# Patient Record
Sex: Male | Born: 1950 | Race: Black or African American | Hispanic: No | Marital: Single | Smoking: Former smoker
Health system: Southern US, Community
[De-identification: ages and names within clinical notes are randomized; demographics above are authoritative.]

## PROBLEM LIST (undated history)

## (undated) DIAGNOSIS — N400 Enlarged prostate without lower urinary tract symptoms: Secondary | ICD-10-CM

## (undated) DIAGNOSIS — E119 Type 2 diabetes mellitus without complications: Secondary | ICD-10-CM

## (undated) DIAGNOSIS — N189 Chronic kidney disease, unspecified: Secondary | ICD-10-CM

## (undated) DIAGNOSIS — I10 Essential (primary) hypertension: Secondary | ICD-10-CM

## (undated) DIAGNOSIS — E7800 Pure hypercholesterolemia, unspecified: Secondary | ICD-10-CM

## (undated) DIAGNOSIS — Z978 Presence of other specified devices: Secondary | ICD-10-CM

## (undated) HISTORY — PX: COLONOSCOPY: SHX174

## (undated) HISTORY — PX: NO PAST SURGERIES: SHX2092

---

## 2003-05-11 ENCOUNTER — Emergency Department (HOSPITAL_COMMUNITY): Admission: EM | Admit: 2003-05-11 | Discharge: 2003-05-11 | Payer: Self-pay | Admitting: Emergency Medicine

## 2007-08-15 ENCOUNTER — Emergency Department (HOSPITAL_COMMUNITY): Admission: EM | Admit: 2007-08-15 | Discharge: 2007-08-16 | Payer: Self-pay | Admitting: Emergency Medicine

## 2007-09-19 ENCOUNTER — Emergency Department (HOSPITAL_COMMUNITY): Admission: EM | Admit: 2007-09-19 | Discharge: 2007-09-19 | Payer: Self-pay | Admitting: Emergency Medicine

## 2009-06-06 ENCOUNTER — Emergency Department (HOSPITAL_COMMUNITY): Admission: EM | Admit: 2009-06-06 | Discharge: 2009-06-06 | Payer: Self-pay | Admitting: Emergency Medicine

## 2011-02-22 ENCOUNTER — Emergency Department (HOSPITAL_COMMUNITY)
Admission: EM | Admit: 2011-02-22 | Discharge: 2011-02-22 | Disposition: A | Discharge status: Home / Self Care | Payer: 59 | Attending: Emergency Medicine | Admitting: Emergency Medicine

## 2011-02-22 ENCOUNTER — Encounter: Payer: Self-pay | Admitting: *Deleted

## 2011-02-22 DIAGNOSIS — I10 Essential (primary) hypertension: Secondary | ICD-10-CM | POA: Insufficient documentation

## 2011-02-22 DIAGNOSIS — Z8639 Personal history of other endocrine, nutritional and metabolic disease: Secondary | ICD-10-CM | POA: Insufficient documentation

## 2011-02-22 DIAGNOSIS — Z862 Personal history of diseases of the blood and blood-forming organs and certain disorders involving the immune mechanism: Secondary | ICD-10-CM | POA: Insufficient documentation

## 2011-02-22 DIAGNOSIS — M25569 Pain in unspecified knee: Secondary | ICD-10-CM | POA: Insufficient documentation

## 2011-02-22 HISTORY — DX: Essential (primary) hypertension: I10

## 2011-02-22 MED ORDER — PREDNISONE 20 MG PO TABS
60.0000 mg | ORAL_TABLET | Freq: Once | ORAL | Status: AC
  Administered 2011-02-22: 60 mg via ORAL
  Filled 2011-02-22: qty 3

## 2011-02-22 MED ORDER — HYDROCODONE-ACETAMINOPHEN 5-500 MG PO TABS: 1.0000 | ORAL_TABLET | Freq: Four times a day (QID) | ORAL | Status: AC | PRN

## 2011-02-22 MED ORDER — PREDNISONE 50 MG PO TABS: ORAL_TABLET | ORAL | Status: AC

## 2011-02-22 NOTE — ED Provider Notes (Signed)
History     CSN: [id number]  Arrival date & time 02/22/11  1205   First MD Initiated Contact with Patient 02/22/11 1237      Chief Complaint  Patient presents with  . Knee Pain    pt c/o gout pain to left knee that began 1 week ago.     (Consider location/radiation/quality/duration/timing/severity/associated sxs/prior treatment) HPI  Patient presents to emergency department complaining of a one to two-week history of left knee pain. Patient states the pain is similar to previous history of gout pain in his knee. Patient is seen by [facility] at [facility] by Dr. Wynelle Link and states that he has been treated with indomethacin in the past for gout. Patient states he had leftover indomethacin and pain meds from his previous gout flare and when the pain began a week ago he began to take the medicine once again. However patient states despite taking daily indomethacin and as needed pain meds he is continuing to have left knee pain. Patient has a followup appointment with his primary care physician at [facility] family medicine on Monday, 2 days from now. Patient states however he was concerned with ongoing knee pain. Patient denies any redness, swelling, or heat in his knee. He denies fevers or chills. Pain is aggravated by movement and improved with rest. Symptoms were gradual onset, persistent and unchanging.  Past Medical History  Diagnosis Date  . Hypertension   . Gout     History reviewed. No pertinent past surgical history.  History reviewed. No pertinent family history.  History  Substance Use Topics  . Smoking status: Former Games developer  . Smokeless tobacco: Not on file  . Alcohol Use: Yes      Review of Systems  All other systems reviewed and are negative.    Allergies  Review of patient's allergies indicates no known allergies.  Home Medications  No current outpatient prescriptions on file.  BP 176/84  Pulse 92  Temp(Src) 98.1 F (36.7 C) (Oral)  Resp 18  Wt 190 lb  (86.183 kg)  SpO2 98%  Physical Exam  Nursing note and vitals reviewed. Constitutional: He is oriented to person, place, and time. He appears well-developed.  HENT:  Head: Normocephalic and atraumatic.  Eyes: Conjunctivae are normal.  Neck: Normal range of motion. Neck supple.  Cardiovascular: Normal rate.   Pulmonary/Chest: Effort normal.  Musculoskeletal: Normal range of motion. He exhibits tenderness. He exhibits no edema.       TTP of entire knee without laxity of ant/post/med/lateral stress. Negative bollotment. No edema, erythema or heat. Pain with FROM but no crepitous. No heat or erythema of knee. FROM of bilateral hips without pain. No TTP or remainder of lower extremities.   Neurological: He is alert and oriented to person, place, and time.  Skin: Skin is warm and dry. No rash noted. No erythema. No pallor.    ED Course  Procedures (including critical care time)  Labs Reviewed - No data to display No results found.   1. Knee pain       MDM  Question acute gout flare versus arthritis. No signs or symptoms of septic joint. Lower extremities are neurovascularly intact. Patient has close followup appointments primary care physician in 2 days.        Jenness Corner, [place] 02/22/11 1255

## 2011-02-23 NOTE — ED Provider Notes (Signed)
Medical screening examination/treatment/procedure(s) were performed by non-physician practitioner and as supervising physician I was immediately available for consultation/collaboration.   Benny Lennert, MD 02/23/11 [phone]

## 2012-07-09 ENCOUNTER — Emergency Department (HOSPITAL_COMMUNITY)
Admission: EM | Admit: 2012-07-09 | Discharge: 2012-07-09 | Disposition: A | Discharge status: Home / Self Care | Payer: 59 | Attending: Emergency Medicine | Admitting: Emergency Medicine

## 2012-07-09 ENCOUNTER — Emergency Department (HOSPITAL_COMMUNITY): Payer: 59

## 2012-07-09 DIAGNOSIS — I10 Essential (primary) hypertension: Secondary | ICD-10-CM | POA: Insufficient documentation

## 2012-07-09 DIAGNOSIS — Z87891 Personal history of nicotine dependence: Secondary | ICD-10-CM | POA: Insufficient documentation

## 2012-07-09 DIAGNOSIS — M109 Gout, unspecified: Secondary | ICD-10-CM | POA: Insufficient documentation

## 2012-07-09 DIAGNOSIS — M79609 Pain in unspecified limb: Secondary | ICD-10-CM | POA: Insufficient documentation

## 2012-07-09 DIAGNOSIS — Z79899 Other long term (current) drug therapy: Secondary | ICD-10-CM | POA: Insufficient documentation

## 2012-07-09 MED ORDER — PREDNISONE 20 MG PO TABS: ORAL_TABLET | ORAL | Status: DC

## 2012-07-09 MED ORDER — HYDROCODONE-ACETAMINOPHEN 5-325 MG PO TABS: 1.0000 | ORAL_TABLET | Freq: Four times a day (QID) | ORAL | Status: DC | PRN

## 2012-07-09 NOTE — ED Provider Notes (Signed)
History    This chart was scribed for David Silk, PA working with David Roller, MD by ED Scribe, David Bonilla. This patient was seen in room WTR7/WTR7 and the patient's care was started at 6:05 PM.   CSN: [id number]  Arrival date & time 07/09/12  1739   First MD Initiated Contact with Patient 07/09/12 1805      Chief Complaint  Patient presents with  . Gout    (Consider location/radiation/quality/duration/timing/severity/associated sxs/prior treatment) HPI HPI Comments: David Bonilla is a 62 y.o. male with h/o gout who presents to the Emergency Department complaining of moderate constant right great toe pain due to gout which started Monday. Pt states he has it about 2-3 times a year in various joint regions of his legs bilaterally. Pt states that standing on his right foot exacerbates pain. He states the pain is 2/10 as of now. He states the pain radiates up into his right foot when he bares weight on it. Pt takes Indocin for his Gout.  Pt states when he was seen previously he was diagnosed with crystal gout and the doctor gave pt prednisone which "knocked it right out". Pt denies fever, chills, cough, nausea, vomiting, diarrhea, SOB, weakness, and any other associated symptoms.     Past Medical History  Diagnosis Date  . Hypertension   . Gout     No past surgical history on file.  No family history on file.  History  Substance Use Topics  . Smoking status: Former Games developer  . Smokeless tobacco: Not on file  . Alcohol Use: Yes      Review of Systems  Constitutional: Negative for fever and chills.  Musculoskeletal: Positive for myalgias and arthralgias.  All other systems reviewed and are negative.    Allergies  Review of patient's allergies indicates no known allergies.  Home Medications   Current Outpatient Rx  Name  Route  Sig  Dispense  Refill  . amLODipine (NORVASC) 5 MG tablet   Oral   Take 5 mg by mouth every morning.         . indomethacin  (INDOCIN) 50 MG capsule   Oral   Take 50 mg by mouth 3 (three) times daily with meals.             BP 160/83  Pulse 91  Temp(Src) 99.3 F (37.4 C) (Oral)  Resp 16  Ht 5\' 10"  (1.778 m)  Wt 215 lb (97.523 kg)  BMI 30.85 kg/m2  SpO2 99%  Physical Exam  Nursing note and vitals reviewed. Constitutional: He is oriented to person, place, and time. He appears well-developed and well-nourished. No distress.  HENT:  Head: Normocephalic and atraumatic.  Right Ear: External ear normal.  Left Ear: External ear normal.  Nose: Nose normal.  Eyes: Conjunctivae are normal.  Neck: Normal range of motion. No tracheal deviation present.  Cardiovascular: Normal rate, regular rhythm, normal heart sounds, intact distal pulses and normal pulses.   Pulmonary/Chest: Effort normal and breath sounds normal. No stridor.  Abdominal: Soft. He exhibits no distension. There is no tenderness.  Musculoskeletal: Normal range of motion. He exhibits tenderness.  Tender to palpation to the medial aspect of right great toe (MTP joint).   Neurological: He is alert and oriented to person, place, and time.  Skin: Skin is warm and dry. He is not diaphoretic.  Psychiatric: He has a normal mood and affect. His behavior is normal.    ED Course  Procedures (including critical care time)  DIAGNOSTIC STUDIES: Oxygen Saturation is 99% on room air, normal by my interpretation.    COORDINATION OF CARE: 6:11 PM Discussed ED treatment with pt and pt agrees.  6:37 PM Discussed with pt gout is evident in right foot.    Labs Reviewed - No data to display Dg Foot Complete Right  07/09/2012   *RADIOLOGY REPORT*  Clinical Data: Right foot pain.  No known injuries.  Possible gout.  RIGHT FOOT COMPLETE - 3+ VIEW  Comparison: None.  Findings: No evidence of acute or subacute fracture or dislocation. Erosion with an overhanging edge involving the head of the first metatarsal at the first MTP joint.  Joint spaces well preserved,  including the first MTP joint space.  Mild soft tissue swelling overlying the first MTP joint. Prominent talar beak.  Tiny plantar calcaneal spur.  IMPRESSION: Possible gout involving the first MTP joint.   Original Report Authenticated By: Hulan Saas, M.D.     1. Gout       MDM  Patient is a 62 year old male with history of gout. He has been experiencing pain in his right great toe consistent with gout flare. XR shows possible gout. Pain has not improved with indomethacin. He will be given a 5 day course of prednisone and some pain medications. He will follow up with his PCP if symptoms do not resolve. Return instructions given. Vital signs stable for discharge. Patient / Family / Caregiver informed of clinical course, understand medical decision-making process, and agree with plan.      I personally performed the services described in this documentation, which was scribed in my presence. The recorded information has been reviewed and is accurate.     Mora Bellman, PA-C 07/09/12 1914

## 2012-07-09 NOTE — ED Notes (Signed)
Pt c/o R great toe pain consistent with gout symptoms. Pt states pain started on Monday. Pt states he has taken Indomethacin for pain, but it is not helping. Pt ambulatory to exam room with steady gait. Pt states he drove himself here.

## 2012-07-10 NOTE — ED Provider Notes (Signed)
Medical screening examination/treatment/procedure(s) were performed by non-physician practitioner and as supervising physician I was immediately available for consultation/collaboration.    Vida Roller, MD 07/10/12 [phone]

## 2012-09-29 ENCOUNTER — Ambulatory Visit: Payer: 59

## 2014-05-24 ENCOUNTER — Encounter (HOSPITAL_COMMUNITY): Payer: Self-pay | Admitting: Emergency Medicine

## 2014-05-24 ENCOUNTER — Emergency Department (HOSPITAL_COMMUNITY)
Admission: EM | Admit: 2014-05-24 | Discharge: 2014-05-24 | Disposition: A | Discharge status: Home / Self Care | Payer: 59 | Attending: Emergency Medicine | Admitting: Emergency Medicine

## 2014-05-24 DIAGNOSIS — I10 Essential (primary) hypertension: Secondary | ICD-10-CM | POA: Insufficient documentation

## 2014-05-24 DIAGNOSIS — Z7952 Long term (current) use of systemic steroids: Secondary | ICD-10-CM | POA: Insufficient documentation

## 2014-05-24 DIAGNOSIS — Z791 Long term (current) use of non-steroidal anti-inflammatories (NSAID): Secondary | ICD-10-CM | POA: Diagnosis not present

## 2014-05-24 DIAGNOSIS — M109 Gout, unspecified: Secondary | ICD-10-CM | POA: Diagnosis not present

## 2014-05-24 DIAGNOSIS — Z87891 Personal history of nicotine dependence: Secondary | ICD-10-CM | POA: Insufficient documentation

## 2014-05-24 DIAGNOSIS — Z7982 Long term (current) use of aspirin: Secondary | ICD-10-CM | POA: Insufficient documentation

## 2014-05-24 DIAGNOSIS — Y92009 Unspecified place in unspecified non-institutional (private) residence as the place of occurrence of the external cause: Secondary | ICD-10-CM | POA: Diagnosis not present

## 2014-05-24 DIAGNOSIS — Y9389 Activity, other specified: Secondary | ICD-10-CM | POA: Diagnosis not present

## 2014-05-24 DIAGNOSIS — S61213A Laceration without foreign body of left middle finger without damage to nail, initial encounter: Secondary | ICD-10-CM | POA: Diagnosis present

## 2014-05-24 DIAGNOSIS — Y998 Other external cause status: Secondary | ICD-10-CM | POA: Diagnosis not present

## 2014-05-24 DIAGNOSIS — S61219A Laceration without foreign body of unspecified finger without damage to nail, initial encounter: Secondary | ICD-10-CM

## 2014-05-24 MED ORDER — PREDNISONE 20 MG PO TABS: 40.0000 mg | ORAL_TABLET | Freq: Every day | ORAL | Status: DC

## 2014-05-24 MED ORDER — LIDOCAINE HCL (PF) 1 % IJ SOLN
5.0000 mL | Freq: Once | INTRAMUSCULAR | Status: AC
  Administered 2014-05-24: 5 mL
  Filled 2014-05-24: qty 5
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
## 2014-05-24 NOTE — Discharge Instructions (Signed)
Laceration Care, Adult °A laceration is a cut or lesion that goes through all layers of the skin and into the tissue just beneath the skin. °TREATMENT  °Some lacerations may not require closure. Some lacerations may not be able to be closed due to an increased risk of infection. It is important to see your caregiver as soon as possible after an injury to minimize the risk of infection and maximize the opportunity for successful closure. °If closure is appropriate, pain medicines may be given, if needed. The wound will be cleaned to help prevent infection. Your caregiver will use stitches (sutures), staples, wound glue (adhesive), or skin adhesive strips to repair the laceration. These tools bring the skin edges together to allow for faster healing and a better cosmetic outcome. However, all wounds will heal with a scar. Once the wound has healed, scarring can be minimized by covering the wound with sunscreen during the day for 1 full year. °HOME CARE INSTRUCTIONS  °For sutures or staples: °· Keep the wound clean and dry. °· If you were given a bandage (dressing), you should change it at least once a day. Also, change the dressing if it becomes wet or dirty, or as directed by your caregiver. °· Wash the wound with soap and water 2 times a day. Rinse the wound off with water to remove all soap. Pat the wound dry with a clean towel. °· After cleaning, apply a thin layer of the antibiotic ointment as recommended by your caregiver. This will help prevent infection and keep the dressing from sticking. °· You may shower as usual after the first 24 hours. Do not soak the wound in water until the sutures are removed. °· Only take over-the-counter or prescription medicines for pain, discomfort, or fever as directed by your caregiver. °· Get your sutures or staples removed as directed by your caregiver. °For skin adhesive strips: °· Keep the wound clean and dry. °· Do not get the skin adhesive strips wet. You may bathe  carefully, using caution to keep the wound dry. °· If the wound gets wet, pat it dry with a clean towel. °· Skin adhesive strips will fall off on their own. You may trim the strips as the wound heals. Do not remove skin adhesive strips that are still stuck to the wound. They will fall off in time. °For wound adhesive: °· You may briefly wet your wound in the shower or bath. Do not soak or scrub the wound. Do not swim. Avoid periods of heavy perspiration until the skin adhesive has fallen off on its own. After showering or bathing, gently pat the wound dry with a clean towel. °· Do not apply liquid medicine, cream medicine, or ointment medicine to your wound while the skin adhesive is in place. This may loosen the film before your wound is healed. °· If a dressing is placed over the wound, be careful not to apply tape directly over the skin adhesive. This may cause the adhesive to be pulled off before the wound is healed. °· Avoid prolonged exposure to sunlight or tanning lamps while the skin adhesive is in place. Exposure to ultraviolet light in the first year will darken the scar. °· The skin adhesive will usually remain in place for 5 to 10 days, then naturally fall off the skin. Do not pick at the adhesive film. °You may need a tetanus shot if: °· You cannot remember when you had your last tetanus shot. °· You have never had a tetanus   shot. If you get a tetanus shot, your arm may swell, get red, and feel warm to the touch. This is common and not a problem. If you need a tetanus shot and you choose not to have one, there is a rare chance of getting tetanus. Sickness from tetanus can be serious. SEEK MEDICAL CARE IF:   You have redness, swelling, or increasing pain in the wound.  You see a red line that goes away from the wound.  You have yellowish-white fluid (pus) coming from the wound.  You have a fever.  You notice a bad smell coming from the wound or dressing.  Your wound breaks open before or  after sutures have been removed.  You notice something coming out of the wound such as wood or glass.  Your wound is on your hand or foot and you cannot move a finger or toe. SEEK IMMEDIATE MEDICAL CARE IF:   Your pain is not controlled with prescribed medicine.  You have severe swelling around the wound causing pain and numbness or a change in color in your arm, hand, leg, or foot.  Your wound splits open and starts bleeding.  You have worsening numbness, weakness, or loss of function of any joint around or beyond the wound.  You develop painful lumps near the wound or on the skin anywhere on your body. MAKE SURE YOU:   Understand these instructions.  Will watch your condition.  Will get help right away if you are not doing well or get worse. Document Released: 02/04/2005 Document Revised: 04/29/2011 Document Reviewed: 07/31/2010 [facility] Patient Information 2015 [place], [place]. This information is not intended to replace advice given to you by your health care provider. Make sure you discuss any questions you have with your health care provider. Gout Gout is an inflammatory arthritis caused by a buildup of uric acid crystals in the joints. Uric acid is a chemical that is normally present in the blood. When the level of uric acid in the blood is too high it can form crystals that deposit in your joints and tissues. This causes joint redness, soreness, and swelling (inflammation). Repeat attacks are common. Over time, uric acid crystals can form into masses (tophi) near a joint, destroying bone and causing disfigurement. Gout is treatable and often preventable. CAUSES  The disease begins with elevated levels of uric acid in the blood. Uric acid is produced by your body when it breaks down a naturally found substance called purines. Certain foods you eat, such as meats and fish, contain high amounts of purines. Causes of an elevated uric acid level include:  Being passed down from  parent to child (heredity).  Diseases that cause increased uric acid production (such as obesity, psoriasis, and certain cancers).  Excessive alcohol use.  Diet, especially diets rich in meat and seafood.  Medicines, including certain cancer-fighting medicines (chemotherapy), water pills (diuretics), and aspirin.  Chronic kidney disease. The kidneys are no longer able to remove uric acid well.  Problems with metabolism. Conditions strongly associated with gout include:  Obesity.  High blood pressure.  High cholesterol.  Diabetes. Not everyone with elevated uric acid levels gets gout. It is not understood why some people get gout and others do not. Surgery, joint injury, and eating too much of certain foods are some of the factors that can lead to gout attacks. SYMPTOMS   An attack of gout comes on quickly. It causes intense pain with redness, swelling, and warmth in a joint.  Fever can  occur.  Often, only one joint is involved. Certain joints are more commonly involved:  Base of the big toe.  Knee.  Ankle.  Wrist.  Finger. Without treatment, an attack usually goes away in a few days to weeks. Between attacks, you usually will not have symptoms, which is different from many other forms of arthritis. DIAGNOSIS  Your caregiver will suspect gout based on your symptoms and exam. In some cases, tests may be recommended. The tests may include:  Blood tests.  Urine tests.  X-rays.  Joint fluid exam. This exam requires a needle to remove fluid from the joint (arthrocentesis). Using a microscope, gout is confirmed when uric acid crystals are seen in the joint fluid. TREATMENT  There are two phases to gout treatment: treating the sudden onset (acute) attack and preventing attacks (prophylaxis).  Treatment of an Acute Attack.  Medicines are used. These include anti-inflammatory medicines or steroid medicines.  An injection of steroid medicine into the affected joint is  sometimes necessary.  The painful joint is rested. Movement can worsen the arthritis.  You may use warm or cold treatments on painful joints, depending which works best for you.  Treatment to Prevent Attacks.  If you suffer from frequent gout attacks, your caregiver may advise preventive medicine. These medicines are started after the acute attack subsides. These medicines either help your kidneys eliminate uric acid from your body or decrease your uric acid production. You may need to stay on these medicines for a very long time.  The early phase of treatment with preventive medicine can be associated with an increase in acute gout attacks. For this reason, during the first few months of treatment, your caregiver may also advise you to take medicines usually used for acute gout treatment. Be sure you understand your caregiver's directions. Your caregiver may make several adjustments to your medicine dose before these medicines are effective.  Discuss dietary treatment with your caregiver or dietitian. Alcohol and drinks high in sugar and fructose and foods such as meat, poultry, and seafood can increase uric acid levels. Your caregiver or dietitian can advise you on drinks and foods that should be limited. HOME CARE INSTRUCTIONS   Do not take aspirin to relieve pain. This raises uric acid levels.  Only take over-the-counter or prescription medicines for pain, discomfort, or fever as directed by your caregiver.  Rest the joint as much as possible. When in bed, keep sheets and blankets off painful areas.  Keep the affected joint raised (elevated).  Apply warm or cold treatments to painful joints. Use of warm or cold treatments depends on which works best for you.  Use crutches if the painful joint is in your leg.  Drink enough fluids to keep your urine clear or pale yellow. This helps your body get rid of uric acid. Limit alcohol, sugary drinks, and fructose drinks.  Follow your dietary  instructions. Pay careful attention to the amount of protein you eat. Your daily diet should emphasize fruits, vegetables, whole grains, and fat-free or low-fat milk products. Discuss the use of coffee, vitamin C, and cherries with your caregiver or dietitian. These may be helpful in lowering uric acid levels.  Maintain a healthy body weight. SEEK MEDICAL CARE IF:   You develop diarrhea, vomiting, or any side effects from medicines.  You do not feel better in 24 hours, or you are getting worse. SEEK IMMEDIATE MEDICAL CARE IF:   Your joint becomes suddenly more tender, and you have chills or a fever.  MAKE SURE YOU:   Understand these instructions.  Will watch your condition.  Will get help right away if you are not doing well or get worse. Document Released: 02/02/2000 Document Revised: 06/21/2013 Document Reviewed: 09/18/2011 [facility] Patient Information 2015 [place], [place]. This information is not intended to replace advice given to you by your health care provider. Make sure you discuss any questions you have with your health care provider.

## 2014-06-05 ENCOUNTER — Emergency Department (HOSPITAL_COMMUNITY)
Admission: EM | Admit: 2014-06-05 | Discharge: 2014-06-05 | Disposition: A | Discharge status: Home / Self Care | Payer: 59 | Attending: Emergency Medicine | Admitting: Emergency Medicine

## 2014-06-05 ENCOUNTER — Encounter (HOSPITAL_COMMUNITY): Payer: Self-pay | Admitting: Emergency Medicine

## 2014-06-05 DIAGNOSIS — I10 Essential (primary) hypertension: Secondary | ICD-10-CM | POA: Insufficient documentation

## 2014-06-05 DIAGNOSIS — Z87891 Personal history of nicotine dependence: Secondary | ICD-10-CM | POA: Insufficient documentation

## 2014-06-05 DIAGNOSIS — Z7982 Long term (current) use of aspirin: Secondary | ICD-10-CM | POA: Insufficient documentation

## 2014-06-05 DIAGNOSIS — Z791 Long term (current) use of non-steroidal anti-inflammatories (NSAID): Secondary | ICD-10-CM | POA: Insufficient documentation

## 2014-06-05 DIAGNOSIS — M109 Gout, unspecified: Secondary | ICD-10-CM | POA: Diagnosis not present

## 2014-06-05 DIAGNOSIS — Z4802 Encounter for removal of sutures: Secondary | ICD-10-CM | POA: Diagnosis present

## 2014-06-05 DIAGNOSIS — Z7952 Long term (current) use of systemic steroids: Secondary | ICD-10-CM | POA: Diagnosis not present

## 2014-06-05 DIAGNOSIS — Z79899 Other long term (current) drug therapy: Secondary | ICD-10-CM | POA: Diagnosis not present

## 2014-06-05 NOTE — ED Provider Notes (Signed)
CSN: [id number]     Arrival date & time 06/05/14  1840 History   First MD Initiated Contact with Patient 06/05/14 1909     Chief Complaint  Patient presents with  . Suture / Staple Removal     (Consider location/radiation/quality/duration/timing/severity/associated sxs/prior Treatment) Patient is a 64 y.o. male presenting with suture removal. The history is provided by the patient and medical records. No language interpreter was used.  Suture / Staple Removal Pertinent negatives include no fever, nausea, numbness, vomiting or weakness.    David Bonilla is a 64 y.o. male  Presents to the emergency department for suture removal of laceration which occurred on 05/24/2014.   Laceration to the left third digit  Repaired with 4 sutures. Patient denies fever, chills, erythema, induration, pain to the site, purulent drainage. He reports no trouble caring for the wound.   He has no other complaints.  Past Medical History  Diagnosis Date  . Hypertension   . Gout    History reviewed. No pertinent past surgical history. History reviewed. No pertinent family history. History  Substance Use Topics  . Smoking status: Former Research scientist (life sciences)  . Smokeless tobacco: Not on file  . Alcohol Use: Yes    Review of Systems  Constitutional: Negative for fever.  Gastrointestinal: Negative for nausea and vomiting.  Skin: Positive for wound.  Allergic/Immunologic: Negative for immunocompromised state.  Neurological: Negative for weakness and numbness.  Hematological: Does not bruise/bleed easily.  Psychiatric/Behavioral: The patient is not nervous/anxious.       Allergies  Review of patient's allergies indicates no known allergies.  Home Medications   Prior to Admission medications   Medication Sig Start Date End Date Taking? Authorizing Provider  amLODipine (NORVASC) 5 MG tablet Take 5 mg by mouth every morning.    Historical Provider, MD  aspirin EC 81 MG tablet Take 81 mg by mouth daily.     Historical Provider, MD  HYDROcodone-acetaminophen (NORCO/VICODIN) 5-325 MG per tablet Take 1 tablet by mouth every 6 (six) hours as needed for pain. Patient not taking: Reported on 05/24/2014 07/09/12   Cleatrice Burke, PA-C  indomethacin (INDOCIN) 50 MG capsule Take 50 mg by mouth 3 (three) times daily with meals.      Historical Provider, MD  predniSONE (DELTASONE) 20 MG tablet Take 2 tablets (40 mg total) by mouth daily. 05/24/14   Waynetta Pean, PA-C  rosuvastatin (CRESTOR) 40 MG tablet Take 40 mg by mouth at bedtime.    Historical Provider, MD   BP 150/87 mmHg  Pulse 90  Temp(Src) 98.4 F (36.9 C) (Oral)  Resp 20  SpO2 96% Physical Exam  Constitutional: He is oriented to person, place, and time. He appears well-developed and well-nourished. No distress.  HENT:  Head: Normocephalic and atraumatic.  Eyes: Conjunctivae are normal. No scleral icterus.  Neck: Normal range of motion.  Cardiovascular: Normal rate, regular rhythm, normal heart sounds and intact distal pulses.   No murmur heard. Capillary refill < 3 sec  Pulmonary/Chest: Effort normal and breath sounds normal. No respiratory distress.  Musculoskeletal: Normal range of motion. He exhibits no edema.  ROM: full range of motion of all fingers of the left hand  Neurological: He is alert and oriented to person, place, and time.  Sensation: intact to dull and sharp Strength: 5/5 in all fingers of the left hand  Skin: Skin is warm and dry. He is not diaphoretic.  2cm semicircular laceration to the left 3rd digit; healing well without erythema, induration or purulent  drainage No TTP of the area  Psychiatric: He has a normal mood and affect.  Nursing note and vitals reviewed.   ED Course  Procedures (including critical care time) Labs Review Labs Reviewed - No data to display  Imaging Review No results found.   EKG Interpretation None      MDM   Final diagnoses:  Visit for suture removal   [name]  presents to ER for staple/suture removal and wound check as above. Procedure tolerated well. Vitals normal, no signs of infection. Scar minimization & return precautions given at dc.    BP 150/87 mmHg  Pulse 90  Temp(Src) 98.4 F (36.9 C) (Oral)  Resp 20  SpO2 96%   Abigail Butts, PA-C 06/05/14 1924  Veryl Speak, MD 06/07/14 2330

## 2014-06-05 NOTE — ED Notes (Signed)
4 stitches to be removed on left middle finger. Edges well approximated, no drainage/redness visible.

## 2014-06-05 NOTE — Discharge Instructions (Signed)

## 2018-03-18 ENCOUNTER — Encounter (INDEPENDENT_AMBULATORY_CARE_PROVIDER_SITE_OTHER): Payer: Self-pay | Admitting: Ophthalmology

## 2018-07-24 ENCOUNTER — Other Ambulatory Visit: Payer: Self-pay | Admitting: Urology

## 2018-07-24 DIAGNOSIS — C61 Malignant neoplasm of prostate: Secondary | ICD-10-CM

## 2018-07-24 DIAGNOSIS — R972 Elevated prostate specific antigen [PSA]: Secondary | ICD-10-CM

## 2018-07-30 ENCOUNTER — Ambulatory Visit
Admission: RE | Admit: 2018-07-30 | Discharge: 2018-07-30 | Disposition: A | Discharge status: Home / Self Care | Payer: 59 | Source: Ambulatory Visit | Attending: Urology | Admitting: Urology

## 2018-07-30 ENCOUNTER — Other Ambulatory Visit: Payer: Self-pay | Admitting: Urology

## 2018-07-30 DIAGNOSIS — Z77018 Contact with and (suspected) exposure to other hazardous metals: Secondary | ICD-10-CM

## 2018-08-03 ENCOUNTER — Other Ambulatory Visit: Payer: Self-pay | Admitting: Urology

## 2018-08-07 ENCOUNTER — Other Ambulatory Visit: Payer: Self-pay | Admitting: Urology

## 2018-08-07 ENCOUNTER — Ambulatory Visit
Admission: RE | Admit: 2018-08-07 | Discharge: 2018-08-07 | Disposition: A | Discharge status: Home / Self Care | Payer: 59 | Source: Ambulatory Visit | Attending: Urology | Admitting: Urology

## 2018-08-07 ENCOUNTER — Other Ambulatory Visit: Payer: Self-pay

## 2018-08-07 DIAGNOSIS — Z77018 Contact with and (suspected) exposure to other hazardous metals: Secondary | ICD-10-CM

## 2018-08-19 ENCOUNTER — Other Ambulatory Visit: Payer: 59

## 2018-08-19 ENCOUNTER — Ambulatory Visit
Admission: RE | Admit: 2018-08-19 | Discharge: 2018-08-19 | Disposition: A | Discharge status: Home / Self Care | Payer: 59 | Source: Ambulatory Visit | Attending: Urology | Admitting: Urology

## 2018-08-19 DIAGNOSIS — R972 Elevated prostate specific antigen [PSA]: Secondary | ICD-10-CM

## 2018-08-19 DIAGNOSIS — C61 Malignant neoplasm of prostate: Secondary | ICD-10-CM

## 2018-08-19 MED ORDER — GADOBENATE DIMEGLUMINE 529 MG/ML IV SOLN
10.0000 mL | Freq: Once | INTRAVENOUS | Status: AC | PRN
  Administered 2018-08-19: 10 mL via INTRAVENOUS

## 2018-11-19 DIAGNOSIS — C801 Malignant (primary) neoplasm, unspecified: Secondary | ICD-10-CM

## 2018-11-19 HISTORY — DX: Malignant (primary) neoplasm, unspecified: C80.1

## 2018-12-23 ENCOUNTER — Other Ambulatory Visit: Payer: Self-pay | Admitting: Nephrology

## 2018-12-23 DIAGNOSIS — N1832 Chronic kidney disease, stage 3b: Secondary | ICD-10-CM

## 2018-12-23 DIAGNOSIS — N179 Acute kidney failure, unspecified: Secondary | ICD-10-CM

## 2018-12-24 ENCOUNTER — Ambulatory Visit
Admission: RE | Admit: 2018-12-24 | Discharge: 2018-12-24 | Disposition: A | Discharge status: Home / Self Care | Payer: 59 | Source: Ambulatory Visit | Attending: Nephrology | Admitting: Nephrology

## 2018-12-24 DIAGNOSIS — N1832 Chronic kidney disease, stage 3b: Secondary | ICD-10-CM

## 2018-12-24 DIAGNOSIS — N179 Acute kidney failure, unspecified: Secondary | ICD-10-CM

## 2019-01-01 ENCOUNTER — Other Ambulatory Visit: Payer: Self-pay | Admitting: Nephrology

## 2019-01-01 DIAGNOSIS — N1832 Chronic kidney disease, stage 3b: Secondary | ICD-10-CM

## 2019-01-11 ENCOUNTER — Ambulatory Visit
Admission: RE | Admit: 2019-01-11 | Discharge: 2019-01-11 | Disposition: A | Discharge status: Home / Self Care | Payer: 59 | Source: Ambulatory Visit | Attending: Nephrology | Admitting: Nephrology

## 2019-01-11 DIAGNOSIS — N1832 Chronic kidney disease, stage 3b: Secondary | ICD-10-CM

## 2019-03-19 ENCOUNTER — Other Ambulatory Visit: Payer: Self-pay | Admitting: Urology

## 2019-03-19 ENCOUNTER — Other Ambulatory Visit (HOSPITAL_COMMUNITY): Payer: Self-pay | Admitting: Urology

## 2019-03-22 ENCOUNTER — Other Ambulatory Visit (HOSPITAL_COMMUNITY): Payer: Self-pay | Admitting: Urology

## 2019-03-22 ENCOUNTER — Other Ambulatory Visit: Payer: Self-pay | Admitting: Urology

## 2019-03-22 DIAGNOSIS — C61 Malignant neoplasm of prostate: Secondary | ICD-10-CM

## 2019-03-31 ENCOUNTER — Encounter (HOSPITAL_BASED_OUTPATIENT_CLINIC_OR_DEPARTMENT_OTHER): Payer: Self-pay | Admitting: Urology

## 2019-03-31 ENCOUNTER — Other Ambulatory Visit: Payer: Self-pay

## 2019-03-31 NOTE — Progress Notes (Signed)
Spoke w/ via phone for pre-op interview---David Bonilla needs dos----  I stat 8, ekg             COVID test ------04-05-2019 Arrive at -------530 am 04-08-2019 NPO after ------midnight Medications to take morning of surgery -----metorpolol succinate, rosuvastatin, tamsulosin, amlodipine, finasteride Diabetic medication -----none day of surgery Patient Special Instructions -----patient given overnight stay instructions including bring all prescription medications in original containers Pre-Op special Istructions ----- Patient verbalized understanding of instructions that were given at this phone interview. Patient denies shortness of breath, chest pain, fever, cough a this phone interview.

## 2019-04-05 ENCOUNTER — Other Ambulatory Visit (HOSPITAL_COMMUNITY)
Admission: RE | Admit: 2019-04-05 | Discharge: 2019-04-05 | Disposition: A | Discharge status: Home / Self Care | Payer: 59 | Source: Ambulatory Visit | Attending: Urology | Admitting: Urology

## 2019-04-05 DIAGNOSIS — Z20822 Contact with and (suspected) exposure to covid-19: Secondary | ICD-10-CM | POA: Insufficient documentation

## 2019-04-05 DIAGNOSIS — Z01812 Encounter for preprocedural laboratory examination: Secondary | ICD-10-CM | POA: Diagnosis not present

## 2019-04-05 LAB — SARS CORONAVIRUS 2 (TAT 6-24 HRS): SARS Coronavirus 2: NEGATIVE

## 2019-04-07 ENCOUNTER — Encounter (HOSPITAL_COMMUNITY): Payer: Self-pay

## 2019-04-07 ENCOUNTER — Encounter (HOSPITAL_BASED_OUTPATIENT_CLINIC_OR_DEPARTMENT_OTHER): Payer: Self-pay | Admitting: Anesthesiology

## 2019-04-07 ENCOUNTER — Emergency Department (HOSPITAL_COMMUNITY)
Admission: EM | Admit: 2019-04-07 | Discharge: 2019-04-07 | Disposition: A | Discharge status: Home / Self Care | Payer: 59 | Attending: Emergency Medicine | Admitting: Emergency Medicine

## 2019-04-07 ENCOUNTER — Other Ambulatory Visit: Payer: Self-pay

## 2019-04-07 DIAGNOSIS — Z87891 Personal history of nicotine dependence: Secondary | ICD-10-CM | POA: Insufficient documentation

## 2019-04-07 DIAGNOSIS — I129 Hypertensive chronic kidney disease with stage 1 through stage 4 chronic kidney disease, or unspecified chronic kidney disease: Secondary | ICD-10-CM | POA: Diagnosis not present

## 2019-04-07 DIAGNOSIS — Z466 Encounter for fitting and adjustment of urinary device: Secondary | ICD-10-CM | POA: Insufficient documentation

## 2019-04-07 DIAGNOSIS — Z978 Presence of other specified devices: Secondary | ICD-10-CM

## 2019-04-07 DIAGNOSIS — N181 Chronic kidney disease, stage 1: Secondary | ICD-10-CM | POA: Insufficient documentation

## 2019-04-07 DIAGNOSIS — Z8546 Personal history of malignant neoplasm of prostate: Secondary | ICD-10-CM | POA: Insufficient documentation

## 2019-04-07 DIAGNOSIS — Z7984 Long term (current) use of oral hypoglycemic drugs: Secondary | ICD-10-CM | POA: Insufficient documentation

## 2019-04-07 DIAGNOSIS — Z79899 Other long term (current) drug therapy: Secondary | ICD-10-CM | POA: Insufficient documentation

## 2019-04-07 DIAGNOSIS — N401 Enlarged prostate with lower urinary tract symptoms: Secondary | ICD-10-CM | POA: Diagnosis not present

## 2019-04-07 DIAGNOSIS — E1122 Type 2 diabetes mellitus with diabetic chronic kidney disease: Secondary | ICD-10-CM | POA: Insufficient documentation

## 2019-04-07 NOTE — H&P (View-Only) (Signed)
I have prostate cancer.  HPI: David Bonilla is a 69 year-old male established patient who is here evaluation for treatment of prostate cancer.    David Bonilla returns today following urodynamics for retention. He has BPH with BOO and very low risk prostate cancer. He is currently on finasteride and tamsulosin but failed to void adequately at [facility] despite a good detrusor contraction. He had a foley placed on 11/25 for retention with bilateral hydro. His PSA had been rising but he had a good f/t ratio. He was given Cipro for his positive culture at [facility] in preparation for cystoscopy but would prefer not to have that procedure.   UDS results: David Bonilla held a max capacity of approx. 535 mls. His 1st sensation was felt at 377 mls. There was positive instability. He voided a small volume with urgency off this contraction. Filling was continued to obtain a voluntary void. He was able to generate a well sustained voluntary contraction but was only able to void 53 mls. Trabeculation and elevation of the bladder base was noted. No reflux was seen. A 16 fr foley was inserted post UDS.   He was biopsied for a PSA of 4.63 with 49% f/t ratio in 11/19. he was found to have a 186ml prostate and T1c Nx Mx Gleason 6(3+3) prostate cancer in 5% of the right apical medial core. He continues to void without difficulty and his PSA was up to 8.76 with a 40% f/t ratio in October prior to his diagnosis with retention. It was 6.39 with a 42% f/t ratio at this last visit in June and 5.01 with a 38% f/t ratio in February. He had an MRIP in 08/19/18 after the rise in the PSA and that showed no high risk lesions but on retrospective review, he did have a full bladder. He had minimal LUTS to belie his degree of retention.     AUA Symptom Score: Less than 20% of the time he has the sensation of not emptying his bladder completely when finished urinating. Less than 20% of the time he has to urinate again fewer than two hours after he  has finished urinating. He does not have to stop and start again several times when he urinates. He never finds it difficult to postpone urination. He never has a weak urinary stream. He never has to push or strain to begin urination. He has to get up to urinate 1 time from the time he goes to bed until the time he gets up in the morning.   Calculated AUA Symptom Score: 3    ALLERGIES: NSAIDs    MEDICATIONS: Cipro 250 mg tablet 1 tablet PO BID  Finasteride 5 mg tablet 1 tablet PO Daily  Tamsulosin Hcl 0.4 mg capsule 1 capsule PO Daily  Losartan-Hydrochlorothiazide 100 mg-25 mg tablet  Rosuvastatin Calcium 40 mg tablet     GU PSH: Complex cystometrogram, w/ void pressure and urethral pressure profile studies, any technique - 03/11/2019 Complex Uroflow - 03/11/2019 Emg surf Electrd - 03/11/2019 Inject For cystogram - 03/11/2019 Insert Bladder Cath; Complex - 01/13/2019 Intrabd voidng Press - 03/11/2019 Prostate Needle Biopsy - 12/25/2017     NON-GU PSH: Surgical Pathology, Gross And Microscopic Examination For Prostate Needle - 12/25/2017     GU PMH: Prostate Cancer - 02/24/2019, His PSA continues to rise and despite the negative MRI in July, he needs to have a repeat biopsy which I will arrange in the near future. Risks reviewed and levaquin sent. , - 11/26/2018 (  Stable), He is doing well but his PSA has continued to rise. the F/t ratio remains favorable and the MRIP showed no high risk lesions. He declined rectal exam today. I will just have him return in 3 months with a PSA. , - 08/27/2018 (Stable), He is doing well with a minimal increase in his PSA. I will have him return in 3 months with a PSA for an exam. , - 04/22/2018, T1c Nx Mx very low risk Gleason 6 prostate cancer with a 110ml prostate with minimal LUTS and moderate ED. I reviewed Active surveillance, RALP and EXRT. His prostate is too large for seeds, cryo and HIFU. He would like to pursue a course of surveillance and I will have him  return in 3 months with a PSA. , - 01/21/2018 BPH w/LUTS, He is having spasms with the foley and is on oxybutynin. the foley is in good position and the bladder is drained but the bladder walls are quite thick and he has persistent though improved bilateral hydronephrosis. I discussed treatment options including meds, Urolift, Rezum, TURP and Radical prostatectomy since he does have prostate cancer and he would like to try med. He will be started on finasteride and tamsulosin and I reviewed the side effects. he will return in about 2 weeks for urodynamics and will stop the oxybutynin 48 hours prior. He will then return with the results for possible cystoscopy. - 01/18/2019 Acute kidney failure - 01/13/2019 Hydronephrosis - 01/13/2019 Urinary Retention - 01/13/2019 ED due to arterial insufficiency, He has moderate ED. - 01/21/2018 Elevated PSA - 12/25/2017, He has 2 elevated PSA's in May with no historical comparisons. I am going to repeat the PSA with reflex to free/total after a week of sexual abstinence. If the PSA remains elevated, he will need a biopsy. I discussed the risks of bleeding, infection and voiding difficulty. I will consider valium for the biopsy since he was uncomfortable with the exam., - 09/15/2017 BPH w/o LUTS, He has minimal LUTs and a benign exam. - 09/15/2017    NON-GU PMH: Diabetes Type 2 GERD Gout Hypertension    FAMILY HISTORY: 1 Daughter - No Family History Breast Cancer - Mother Hypertension - Father, Brother Kidney Failure - Brother Lung Cancer - Brother stroke - Father   SOCIAL HISTORY: Marital Status: Single Preferred Language: English; Race: Black or African American Current Smoking Status: Patient does not smoke anymore.   Tobacco Use Assessment Completed: Used Tobacco in last 30 days? Drinks 1 drink per day. Types of alcohol consumed: Beer.  Drinks 2 caffeinated drinks per day. Has not had a blood transfusion. Patient's occupation Psychologist, prison and probation services.    REVIEW OF SYSTEMS:    GU Review Male:   Patient denies frequent urination, hard to postpone urination, burning/ pain with urination, get up at night to urinate, leakage of urine, stream starts and stops, trouble starting your stream, have to strain to urinate , erection problems, and penile pain.  Gastrointestinal (Upper):   Patient denies nausea, vomiting, and indigestion/ heartburn.  Gastrointestinal (Lower):   Patient denies diarrhea and constipation.  Constitutional:   Patient denies fever, night sweats, weight loss, and fatigue.  Skin:   Patient denies skin rash/ lesion and itching.  Eyes:   Patient denies double vision and blurred vision.  Ears/ Nose/ Throat:   Patient denies sore throat and sinus problems.  Hematologic/Lymphatic:   Patient denies swollen glands and easy bruising.  Cardiovascular:   Patient denies leg swelling and chest pains.  Respiratory:  Patient denies cough and shortness of breath.  Endocrine:   Patient denies excessive thirst.  Musculoskeletal:   Patient denies back pain and joint pain.  Neurological:   Patient denies headaches and dizziness.  Psychologic:   Patient denies depression and anxiety.   VITAL SIGNS:      03/18/2019 09:19 AM  BP 127/79 mmHg  Pulse 88 /min  Temperature 98.7 F / 37.0 C   MULTI-SYSTEM PHYSICAL EXAMINATION:    Constitutional: Well-nourished. No physical deformities. Normally developed. Good grooming.  Respiratory: Normal breath sounds. No labored breathing, no use of accessory muscles.   Cardiovascular: Regular rate and rhythm. No murmur, no gallop.Marland Kitchen      PAST DATA REVIEWED:  Source Of History:  Patient  Urine Test Review:   Urine Culture  Urodynamics Review:   Review Urodynamics Tests  X-Ray Review: MRI Prostate GSORAD: Reviewed Films. Reviewed Report. Discussed With Patient. His prostate volume was 68ml in July with a small middle lobe     11/20/18 07/16/18 04/15/18 11/28/17 07/17/17 06/27/17  PSA  Total PSA  8.76 ng/mL 6.39 ng/mL 5.01 ng/mL 4.63 ng/mL 4.81 ng/dl 4.33 ng/dl  Free PSA 3.51 ng/mL 2.69 ng/mL 1.88 ng/mL 2.25 ng/mL    % Free PSA 40 % PSA 42 % PSA 38 % PSA 49 % PSA      PROCEDURES:          Urinalysis w/Scope Dipstick Dipstick Cont'd Micro  Color: Yellow Bilirubin: Neg mg/dL WBC/hpf: 40 - 60/hpf  Appearance: Cloudy Ketones: Neg mg/dL RBC/hpf: 3 - 10/hpf  Specific Gravity: 1.025 Blood: 1+ ery/uL Bacteria: Few (10-25/hpf)  pH: <=5.0 Protein: 2+ mg/dL Cystals: Amorph Urates  Glucose: Neg mg/dL Urobilinogen: 0.2 mg/dL Casts: NS (Not Seen)    Nitrites: Neg Trichomonas: Not Present    Leukocyte Esterase: 3+ leu/uL Mucous: Present      Epithelial Cells: 0 - 5/hpf      Yeast: NS (Not Seen)      Sperm: Not Present    Notes: Amorphous urates and uric acid.    ASSESSMENT:      ICD-10 Details  1 GU:   BPH w/LUTS - N40.1 Chronic, Stable - He has persistent retention. I will get him set up for a TURP and prostate biopsy. I don't think minimally invasive procedures would be appropriate for his needs. I reviewd the risks of a TURP including bleeding, infection, incontinence, stricture, need for secondary procedures, ejaculatory and erectile dysfunction, thrombotic events, fluid overload and anesthetic complications. I explained that 95% of men will have relief of the obstructive symptoms and about 70% will have relief of the irritative symptoms.   2   Prostate Cancer - C61 Chronic, Stable - Repeat biopsy at time of surgery.   3   Urinary Retention - R33.8 Chronic, Stable   PLAN:           Schedule Return Visit/Planned Activity: ASAP - Schedule Surgery          Document Letter(s):  Created for Patient: Clinical Summary

## 2019-04-07 NOTE — H&P (Signed)
I have prostate cancer.  HPI: David Bonilla is a 69 year-old male established patient who is here evaluation for treatment of prostate cancer.    David Bonilla returns today following urodynamics for retention. He has BPH with BOO and very low risk prostate cancer. He is currently on finasteride and tamsulosin but failed to void adequately at [facility] despite a good detrusor contraction. He had a foley placed on 11/25 for retention with bilateral hydro. His PSA had been rising but he had a good f/t ratio. He was given Cipro for his positive culture at U[facility] in preparation for cystoscopy but would prefer not to have that procedure.   UDS results: David Bonilla held a max capacity of approx. 535 mls. His 1st sensation was felt at 377 mls. There was positive instability. He voided a small volume with urgency off this contraction. Filling was continued to obtain a voluntary void. He was able to generate a well sustained voluntary contraction but was only able to void 53 mls. Trabeculation and elevation of the bladder base was noted. No reflux was seen. A 16 fr foley was inserted post UDS.   He was biopsied for a PSA of 4.63 with 49% f/t ratio in 11/19. he was found to have a 122ml prostate and T1c Nx Mx Gleason 6(3+3) prostate cancer in 5% of the right apical medial core. He continues to void without difficulty and his PSA was up to 8.76 with a 40% f/t ratio in October prior to his diagnosis with retention. It was 6.39 with a 42% f/t ratio at this last visit in June and 5.01 with a 38% f/t ratio in February. He had an MRIP in 08/19/18 after the rise in the PSA and that showed no high risk lesions but on retrospective review, he did have a full bladder. He had minimal LUTS to belie his degree of retention.     AUA Symptom Score: Less than 20% of the time he has the sensation of not emptying his bladder completely when finished urinating. Less than 20% of the time he has to urinate again fewer than two hours after he  has finished urinating. He does not have to stop and start again several times when he urinates. He never finds it difficult to postpone urination. He never has a weak urinary stream. He never has to push or strain to begin urination. He has to get up to urinate 1 time from the time he goes to bed until the time he gets up in the morning.   Calculated AUA Symptom Score: 3    ALLERGIES: NSAIDs    MEDICATIONS: Cipro 250 mg tablet 1 tablet PO BID  Finasteride 5 mg tablet 1 tablet PO Daily  Tamsulosin Hcl 0.4 mg capsule 1 capsule PO Daily  Losartan-Hydrochlorothiazide 100 mg-25 mg tablet  Rosuvastatin Calcium 40 mg tablet     GU PSH: Complex cystometrogram, w/ void pressure and urethral pressure profile studies, any technique - 03/11/2019 Complex Uroflow - 03/11/2019 Emg surf Electrd - 03/11/2019 Inject For cystogram - 03/11/2019 Insert Bladder Cath; Complex - 01/13/2019 Intrabd voidng Press - 03/11/2019 Prostate Needle Biopsy - 12/25/2017     NON-GU PSH: Surgical Pathology, Gross And Microscopic Examination For Prostate Needle - 12/25/2017     GU PMH: Prostate Cancer - 02/24/2019, His PSA continues to rise and despite the negative MRI in July, he needs to have a repeat biopsy which I will arrange in the near future. Risks reviewed and levaquin sent. , - 11/26/2018 (  Stable), He is doing well but his PSA has continued to rise. the F/t ratio remains favorable and the MRIP showed no high risk lesions. He declined rectal exam today. I will just have him return in 3 months with a PSA. , - 08/27/2018 (Stable), He is doing well with a minimal increase in his PSA. I will have him return in 3 months with a PSA for an exam. , - 04/22/2018, T1c Nx Mx very low risk Gleason 6 prostate cancer with a 162ml prostate with minimal LUTS and moderate ED. I reviewed Active surveillance, RALP and EXRT. His prostate is too large for seeds, cryo and HIFU. He would like to pursue a course of surveillance and I will have him  return in 3 months with a PSA. , - 01/21/2018 BPH w/LUTS, He is having spasms with the foley and is on oxybutynin. the foley is in good position and the bladder is drained but the bladder walls are quite thick and he has persistent though improved bilateral hydronephrosis. I discussed treatment options including meds, Urolift, Rezum, TURP and Radical prostatectomy since he does have prostate cancer and he would like to try med. He will be started on finasteride and tamsulosin and I reviewed the side effects. he will return in about 2 weeks for urodynamics and will stop the oxybutynin 48 hours prior. He will then return with the results for possible cystoscopy. - 01/18/2019 Acute kidney failure - 01/13/2019 Hydronephrosis - 01/13/2019 Urinary Retention - 01/13/2019 ED due to arterial insufficiency, He has moderate ED. - 01/21/2018 Elevated PSA - 12/25/2017, He has 2 elevated PSA's in May with no historical comparisons. I am going to repeat the PSA with reflex to free/total after a week of sexual abstinence. If the PSA remains elevated, he will need a biopsy. I discussed the risks of bleeding, infection and voiding difficulty. I will consider valium for the biopsy since he was uncomfortable with the exam., - 09/15/2017 BPH w/o LUTS, He has minimal LUTs and a benign exam. - 09/15/2017    NON-GU PMH: Diabetes Type 2 GERD Gout Hypertension    FAMILY HISTORY: 1 Daughter - No Family History Breast Cancer - Mother Hypertension - Father, Brother Kidney Failure - Brother Lung Cancer - Brother stroke - Father   SOCIAL HISTORY: Marital Status: Single Preferred Language: English; Race: Black or African American Current Smoking Status: Patient does not smoke anymore.   Tobacco Use Assessment Completed: Used Tobacco in last 30 days? Drinks 1 drink per day. Types of alcohol consumed: Beer.  Drinks 2 caffeinated drinks per day. Has not had a blood transfusion. Patient's occupation Psychologist, prison and probation services.    REVIEW OF SYSTEMS:    GU Review Male:   Patient denies frequent urination, hard to postpone urination, burning/ pain with urination, get up at night to urinate, leakage of urine, stream starts and stops, trouble starting your stream, have to strain to urinate , erection problems, and penile pain.  Gastrointestinal (Upper):   Patient denies nausea, vomiting, and indigestion/ heartburn.  Gastrointestinal (Lower):   Patient denies diarrhea and constipation.  Constitutional:   Patient denies fever, night sweats, weight loss, and fatigue.  Skin:   Patient denies skin rash/ lesion and itching.  Eyes:   Patient denies double vision and blurred vision.  Ears/ Nose/ Throat:   Patient denies sore throat and sinus problems.  Hematologic/Lymphatic:   Patient denies swollen glands and easy bruising.  Cardiovascular:   Patient denies leg swelling and chest pains.  Respiratory:  Patient denies cough and shortness of breath.  Endocrine:   Patient denies excessive thirst.  Musculoskeletal:   Patient denies back pain and joint pain.  Neurological:   Patient denies headaches and dizziness.  Psychologic:   Patient denies depression and anxiety.   VITAL SIGNS:      03/18/2019 09:19 AM  BP 127/79 mmHg  Pulse 88 /min  Temperature 98.7 F / 37.0 C   MULTI-SYSTEM PHYSICAL EXAMINATION:    Constitutional: Well-nourished. No physical deformities. Normally developed. Good grooming.  Respiratory: Normal breath sounds. No labored breathing, no use of accessory muscles.   Cardiovascular: Regular rate and rhythm. No murmur, no gallop.Marland Kitchen      PAST DATA REVIEWED:  Source Of History:  Patient  Urine Test Review:   Urine Culture  Urodynamics Review:   Review Urodynamics Tests  X-Ray Review: MRI Prostate GSORAD: Reviewed Films. Reviewed Report. Discussed With Patient. His prostate volume was 69ml in July with a small middle lobe     11/20/18 07/16/18 04/15/18 11/28/17 07/17/17 06/27/17  PSA  Total PSA  8.76 ng/mL 6.39 ng/mL 5.01 ng/mL 4.63 ng/mL 4.81 ng/dl 4.33 ng/dl  Free PSA 3.51 ng/mL 2.69 ng/mL 1.88 ng/mL 2.25 ng/mL    % Free PSA 40 % PSA 42 % PSA 38 % PSA 49 % PSA      PROCEDURES:          Urinalysis w/Scope Dipstick Dipstick Cont'd Micro  Color: Yellow Bilirubin: Neg mg/dL WBC/hpf: 40 - 60/hpf  Appearance: Cloudy Ketones: Neg mg/dL RBC/hpf: 3 - 10/hpf  Specific Gravity: 1.025 Blood: 1+ ery/uL Bacteria: Few (10-25/hpf)  pH: <=5.0 Protein: 2+ mg/dL Cystals: Amorph Urates  Glucose: Neg mg/dL Urobilinogen: 0.2 mg/dL Casts: NS (Not Seen)    Nitrites: Neg Trichomonas: Not Present    Leukocyte Esterase: 3+ leu/uL Mucous: Present      Epithelial Cells: 0 - 5/hpf      Yeast: NS (Not Seen)      Sperm: Not Present    Notes: Amorphous urates and uric acid.    ASSESSMENT:      ICD-10 Details  1 GU:   BPH w/LUTS - N40.1 Chronic, Stable - He has persistent retention. I will get him set up for a TURP and prostate biopsy. I don't think minimally invasive procedures would be appropriate for his needs. I reviewd the risks of a TURP including bleeding, infection, incontinence, stricture, need for secondary procedures, ejaculatory and erectile dysfunction, thrombotic events, fluid overload and anesthetic complications. I explained that 95% of men will have relief of the obstructive symptoms and about 70% will have relief of the irritative symptoms.   2   Prostate Cancer - C61 Chronic, Stable - Repeat biopsy at time of surgery.   3   Urinary Retention - R33.8 Chronic, Stable   PLAN:           Schedule Return Visit/Planned Activity: ASAP - Schedule Surgery          Document Letter(s):  Created for Patient: Clinical Summary

## 2019-04-07 NOTE — Anesthesia Preprocedure Evaluation (Deleted)
Anesthesia Evaluation    Reviewed: Allergy & Precautions, Patient's Chart, lab work & pertinent test results  Airway        Dental   Pulmonary former smoker,           Cardiovascular hypertension, Pt. on medications and Pt. on home beta blockers      Neuro/Psych negative neurological ROS     GI/Hepatic negative GI ROS, Neg liver ROS,   Endo/Other  diabetes, Oral Hypoglycemic Agents  Renal/GU Renal disease     Musculoskeletal Gout   Abdominal (+) + obese,   Peds  Hematology HLD   Anesthesia Other Findings BENIGN PROSTATE HYPERPLASIA WITH RETENTIN, PROSTATE CANCER  Reproductive/Obstetrics                             Anesthesia Physical Anesthesia Plan  ASA: III  Anesthesia Plan: General   Post-op Pain Management:    Induction: Intravenous  PONV Risk Score and Plan: 3 and Midazolam, Dexamethasone, Ondansetron and Treatment may vary due to age or medical condition  Airway Management Planned: LMA  Additional Equipment:   Intra-op Plan:   Post-operative Plan: Extubation in OR  Informed Consent:   Plan Discussed with:   Anesthesia Plan Comments:         Anesthesia Quick Evaluation
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
## 2019-04-07 NOTE — Discharge Instructions (Signed)
Return here if any ongoing issues with the foley-- no drainage, bleeding, fever, etc.

## 2019-04-07 NOTE — ED Triage Notes (Signed)
Patient arrived stating that he just needs a foley catheter bag due to his leaking.

## 2019-04-08 ENCOUNTER — Ambulatory Visit (HOSPITAL_COMMUNITY): Payer: 59

## 2019-04-08 ENCOUNTER — Ambulatory Visit (HOSPITAL_BASED_OUTPATIENT_CLINIC_OR_DEPARTMENT_OTHER): Admission: RE | Admit: 2019-04-08 | Payer: 59 | Source: Home / Self Care | Admitting: Urology

## 2019-04-08 HISTORY — DX: Presence of other specified devices: Z97.8

## 2019-04-08 HISTORY — DX: Pure hypercholesterolemia, unspecified: E78.00

## 2019-04-08 HISTORY — DX: Type 2 diabetes mellitus without complications: E11.9

## 2019-04-08 HISTORY — DX: Chronic kidney disease, unspecified: N18.9

## 2019-04-08 HISTORY — DX: Benign prostatic hyperplasia without lower urinary tract symptoms: N40.0

## 2019-04-08 SURGERY — BIOPSY, PROSTATE, RECTAL APPROACH, WITH US GUIDANCE
Anesthesia: General

## 2019-04-12 ENCOUNTER — Other Ambulatory Visit (HOSPITAL_COMMUNITY)
Admission: RE | Admit: 2019-04-12 | Discharge: 2019-04-12 | Disposition: A | Discharge status: Home / Self Care | Payer: 59 | Source: Ambulatory Visit | Attending: Urology | Admitting: Urology

## 2019-04-12 ENCOUNTER — Other Ambulatory Visit (HOSPITAL_BASED_OUTPATIENT_CLINIC_OR_DEPARTMENT_OTHER): Payer: Self-pay | Admitting: Urology

## 2019-04-12 ENCOUNTER — Other Ambulatory Visit: Payer: Self-pay | Admitting: Urology

## 2019-04-12 ENCOUNTER — Other Ambulatory Visit (HOSPITAL_COMMUNITY): Payer: Self-pay | Admitting: *Deleted

## 2019-04-12 DIAGNOSIS — Z20822 Contact with and (suspected) exposure to covid-19: Secondary | ICD-10-CM | POA: Diagnosis not present

## 2019-04-12 DIAGNOSIS — Z01812 Encounter for preprocedural laboratory examination: Secondary | ICD-10-CM | POA: Insufficient documentation

## 2019-04-12 DIAGNOSIS — C61 Malignant neoplasm of prostate: Secondary | ICD-10-CM

## 2019-04-12 LAB — SARS CORONAVIRUS 2 (TAT 6-24 HRS): SARS Coronavirus 2: NEGATIVE

## 2019-04-12 NOTE — Patient Instructions (Addendum)
DUE TO COVID-19 ONLY ONE VISITOR IS ALLOWED TO COME WITH YOU AND STAY IN THE WAITING ROOM ONLY DURING PRE OP AND PROCEDURE DAY OF SURGERY. THE 1 VISITOR MAY VISIT WITH YOU AFTER SURGERY IN YOUR PRIVATE ROOM DURING VISITING HOURS ONLY!   ONCE YOUR COVID TEST IS COMPLETED, PLEASE BEGIN THE QUARANTINE INSTRUCTIONS AS OUTLINED IN YOUR HANDOUT.                David Bonilla     Your procedure is scheduled on: Thursday 04/15/2019   Report to [facility] Main  Entrance    Report to admitting at   1025 AM     Call this number if you have problems the morning of surgery [phone]    How to Manage Your Diabetes Before and After Surgery  Why is it important to control my blood sugar before and after surgery? . Improving blood sugar levels before and after surgery helps healing and can limit problems. . A way of improving blood sugar control is eating a healthy diet by: o  Eating less sugar and carbohydrates o  Increasing activity/exercise o  Talking with your doctor about reaching your blood sugar goals . High blood sugars (greater than 180 mg/dL) can raise your risk of infections and slow your recovery, so you will need to focus on controlling your diabetes during the weeks before surgery. . Make sure that the doctor who takes care of your diabetes knows about your planned surgery including the date and location.  How do I manage my blood sugar before surgery? . Check your blood sugar at least 4 times a day, starting 2 days before surgery, to make sure that the level is not too high or low. o Check your blood sugar the morning of your surgery when you wake up and every 2 hours until you get to the Short Stay unit. . If your blood sugar is less than 70 mg/dL, you will need to treat for low blood sugar: o Do not take insulin. o Treat a low blood sugar (less than 70 mg/dL) with  cup of clear juice (cranberry or apple), 4 glucose tablets, OR glucose gel. o Recheck blood sugar in  15 minutes after treatment (to make sure it is greater than 70 mg/dL). If your blood sugar is not greater than 70 mg/dL on recheck, call [phone] for further instructions. . Report your blood sugar to the short stay nurse when you get to Short Stay.  . If you are admitted to the hospital after surgery: o Your blood sugar will be checked by the staff and you will probably be given insulin after surgery (instead of oral diabetes medicines) to make sure you have good blood sugar levels. o The goal for blood sugar control after surgery is 80-180 mg/dL.   WHAT DO I DO ABOUT MY DIABETES MEDICATION?        The day before surgery, Take Glimepiride (Amaryl) as prescribed!  . Do not take oral diabetes medicines (pills) the morning of surgery.       Remember: Do not eat food or drink liquids :After Midnight.      BRUSH YOUR TEETH MORNING OF SURGERY AND RINSE YOUR MOUTH OUT, NO CHEWING GUM CANDY OR MINTS.     Take these medicines the morning of surgery with A SIP OF WATER: Metoprolol succinate (Toprol-XL), Amlodipine (Norvasc), Tamulosin (Flomax), Rosuvastatin (Crestor), Finesteride (Proscar)   DO NOT TAKE ANY DIABETIC MEDICATIONS DAY OF YOUR SURGERY!  You may not have any metal on your body including hair pins and              piercings  Do not wear jewelry, make-up, lotions, powders or perfumes, deodorant                           Men may shave face and neck.   Do not bring valuables to the hospital. [place].  Contacts, dentures or bridgework may not be worn into surgery.  Leave suitcase in the car. After surgery it may be brought to your room.     Patients discharged the day of surgery will not be allowed to drive home. IF YOU ARE HAVING SURGERY AND GOING HOME THE SAME DAY, YOU MUST HAVE AN ADULT TO DRIVE YOU HOME AND  BE WITH YOU FOR 24 HOURS. YOU MAY GO HOME BY TAXI OR UBER OR ORTHERWISE, BUT AN ADULT  MUST ACCOMPANY YOU HOME AND STAY WITH YOU FOR 24 HOURS.  Name and phone number of your driver:girlfriend- David Bonilla               Please read over the following fact sheets you were given: _____________________________________________________________________             [facility] - Preparing for Surgery Before surgery, you can play an important role.  Because skin is not sterile, your skin needs to be as free of germs as possible.  You can reduce the number of germs on your skin by washing with CHG (chlorahexidine gluconate) soap before surgery.  CHG is an antiseptic cleaner which kills germs and bonds with the skin to continue killing germs even after washing. Please DO NOT use if you have an allergy to CHG or antibacterial soaps.  If your skin becomes reddened/irritated stop using the CHG and inform your nurse when you arrive at Short Stay. Do not shave (including legs and underarms) for at least 48 hours prior to the first CHG shower.  You may shave your face/neck. Please follow these instructions carefully:  1.  Shower with CHG Soap the night before surgery and the  morning of Surgery.  2.  If you choose to wash your hair, wash your hair first as usual with your  normal  shampoo.  3.  After you shampoo, rinse your hair and body thoroughly to remove the  shampoo.                           4.  Use CHG as you would any other liquid soap.  You can apply chg directly  to the skin and wash                       Gently with a scrungie or clean washcloth.  5.  Apply the CHG Soap to your body ONLY FROM THE NECK DOWN.   Do not use on face/ open                           Wound or open sores. Avoid contact with eyes, ears mouth and genitals (private parts).                       Wash face,  Genitals (private parts) with your normal soap.             6.  Wash thoroughly, paying special attention to the area where your surgery  will be performed.  7.  Thoroughly rinse your body with warm water from  the neck down.  8.  DO NOT shower/wash with your normal soap after using and rinsing off  the CHG Soap.                9.  Pat yourself dry with a clean towel.            10.  Wear clean pajamas.            11.  Place clean sheets on your bed the night of your first shower and do not  sleep with pets. Day of Surgery : Do not apply any lotions/deodorants the morning of surgery.  Please wear clean clothes to the hospital/surgery center.  FAILURE TO FOLLOW THESE INSTRUCTIONS MAY RESULT IN THE CANCELLATION OF YOUR SURGERY PATIENT SIGNATURE_________________________________  NURSE SIGNATURE__________________________________  ________________________________________________________________________

## 2019-04-13 ENCOUNTER — Encounter (HOSPITAL_COMMUNITY): Payer: Self-pay

## 2019-04-13 ENCOUNTER — Encounter (HOSPITAL_COMMUNITY)
Admission: RE | Admit: 2019-04-13 | Discharge: 2019-04-13 | Disposition: A | Discharge status: Home / Self Care | Payer: 59 | Source: Ambulatory Visit | Attending: Urology | Admitting: Urology

## 2019-04-13 ENCOUNTER — Other Ambulatory Visit: Payer: Self-pay

## 2019-04-13 DIAGNOSIS — E7800 Pure hypercholesterolemia, unspecified: Secondary | ICD-10-CM | POA: Insufficient documentation

## 2019-04-13 DIAGNOSIS — C61 Malignant neoplasm of prostate: Secondary | ICD-10-CM | POA: Diagnosis not present

## 2019-04-13 DIAGNOSIS — I129 Hypertensive chronic kidney disease with stage 1 through stage 4 chronic kidney disease, or unspecified chronic kidney disease: Secondary | ICD-10-CM | POA: Insufficient documentation

## 2019-04-13 DIAGNOSIS — Z01818 Encounter for other preprocedural examination: Secondary | ICD-10-CM | POA: Diagnosis not present

## 2019-04-13 DIAGNOSIS — R338 Other retention of urine: Secondary | ICD-10-CM | POA: Insufficient documentation

## 2019-04-13 DIAGNOSIS — Z79899 Other long term (current) drug therapy: Secondary | ICD-10-CM | POA: Insufficient documentation

## 2019-04-13 DIAGNOSIS — N183 Chronic kidney disease, stage 3 unspecified: Secondary | ICD-10-CM | POA: Diagnosis not present

## 2019-04-13 DIAGNOSIS — Z01810 Encounter for preprocedural cardiovascular examination: Secondary | ICD-10-CM | POA: Diagnosis not present

## 2019-04-13 DIAGNOSIS — N401 Enlarged prostate with lower urinary tract symptoms: Secondary | ICD-10-CM | POA: Insufficient documentation

## 2019-04-13 DIAGNOSIS — Z01812 Encounter for preprocedural laboratory examination: Secondary | ICD-10-CM | POA: Diagnosis not present

## 2019-04-13 DIAGNOSIS — Z87891 Personal history of nicotine dependence: Secondary | ICD-10-CM | POA: Insufficient documentation

## 2019-04-13 DIAGNOSIS — E1122 Type 2 diabetes mellitus with diabetic chronic kidney disease: Secondary | ICD-10-CM | POA: Insufficient documentation

## 2019-04-13 DIAGNOSIS — Z7984 Long term (current) use of oral hypoglycemic drugs: Secondary | ICD-10-CM | POA: Insufficient documentation

## 2019-04-13 LAB — CBC
HCT: 45.6 % (ref 39.0–52.0)
Hemoglobin: 15.1 g/dL (ref 13.0–17.0)
MCH: 29 pg (ref 26.0–34.0)
MCHC: 33.1 g/dL (ref 30.0–36.0)
MCV: 87.5 fL (ref 80.0–100.0)
Platelets: 251 10*3/uL (ref 150–400)
RBC: 5.21 MIL/uL (ref 4.22–5.81)
RDW: 13.6 % (ref 11.5–15.5)
WBC: 7.7 10*3/uL (ref 4.0–10.5)
nRBC: 0 % (ref 0.0–0.2)

## 2019-04-13 LAB — HEMOGLOBIN A1C
Hgb A1c MFr Bld: 6 % — ABNORMAL HIGH (ref 4.8–5.6)
Mean Plasma Glucose: 125.5 mg/dL

## 2019-04-13 LAB — BASIC METABOLIC PANEL
Anion gap: 11 (ref 5–15)
BUN: 30 mg/dL — ABNORMAL HIGH (ref 8–23)
CO2: 21 mmol/L — ABNORMAL LOW (ref 22–32)
Calcium: 9.4 mg/dL (ref 8.9–10.3)
Chloride: 105 mmol/L (ref 98–111)
Creatinine, Ser: 2.03 mg/dL — ABNORMAL HIGH (ref 0.61–1.24)
GFR calc Af Amer: 38 mL/min — ABNORMAL LOW (ref >60)
GFR calc non Af Amer: 33 mL/min — ABNORMAL LOW (ref >60)
Glucose, Bld: 78 mg/dL (ref 70–99)
Potassium: 3.9 mmol/L (ref 3.5–5.1)
Sodium: 137 mmol/L (ref 135–145)

## 2019-04-13 LAB — GLUCOSE, CAPILLARY: Glucose-Capillary: 82 mg/dL (ref 70–99)

## 2019-04-13 NOTE — Progress Notes (Addendum)
PCP - Marilynne Drivers, PA Cardiologist - n/a Nephrologist- Dr. Nicholas Lose Kidney  LOV-03/31/2019 and 03/31/2019-lab-BMP on chart  Chest x-ray - n/a EKG - n/a Stress Test - n/a ECHO - n/a Cardiac Cath -n/a   Sleep Study - n/a CPAP - n/a  Fasting Blood Sugar - patient does not check blood glucose Checks Blood Sugar __0___ times a day  Blood Thinner Instructions:n/a Aspirin Instructions:n/a Last Dose:n/a  Anesthesia review:   Patient has a history of BPH, CKD Stage 1, DM type 2, gout and HTN.  Patient denies shortness of breath, fever, cough and chest pain at PAT appointment   Patient verbalized understanding of instructions that were given to them at the PAT appointment. Patient was also instructed that they will need to review over the PAT instructions again at home before surgery.

## 2019-04-14 MED ORDER — GENTAMICIN SULFATE 40 MG/ML IJ SOLN
420.0000 mg | INTRAVENOUS | Status: AC
  Administered 2019-04-15: 420 mg via INTRAVENOUS
  Filled 2019-04-14: qty 10.5

## 2019-04-14 NOTE — Progress Notes (Signed)
Anesthesia Chart Review   Case: [id number] Date/Time: 04/15/19 1210   Procedures:      BIOPSY TRANSRECTAL ULTRASONIC PROSTATE (TUBP) (N/A )     TRANSURETHRAL RESECTION OF THE PROSTATE (TURP) (N/A )   Anesthesia type: General   Pre-op diagnosis: BENIGN PROSTATE HYPERPLASIA WITH RETENTION  PROSTATE CANCER   Location: [facility] PROCEDURE ROOM / [facility]   Surgeons: Irine Seal, MD      DISCUSSION:68 y.o. former smoker (30 pack years) with h/o HTN, DM II, CKD Stage III, BPH with retention, prostate cancer scheduled for above procedure 04/15/19 with Dr. Irine Seal.   Pt last seen by nephrologist, Dr. Pearson Grippe, 03/31/19.  Per OV note creatinine is stable, improved from 3.36 10/2018.  Per OV note, "Overall he appears to be doing well.  Update BMP today to see how much recoery he has had Foley catheter.  It sounds like definitive management of his obstruction is next week, will request records from urology, not sure what exact procedure he is to be receiving.  Blood pressure appears well controlled.  No changes in medications are currently indicated. We discussed the importance of NSAID avoidance."  Anticipate pt can proceed with planned procedure barring acute status change.   VS: BP (!) 162/77   Pulse 91   Temp 36.4 C (Oral)   Resp 16   Ht 5\' 10"  (1.778 m)   Wt 97.5 kg   SpO2 97%   BMI 30.85 kg/m   PROVIDERS: Lois Huxley, PA is PCP   Joelyn Oms, MD is Nephrologist  LABS: Labs reviewed: Acceptable for surgery. (all labs ordered are listed, but only abnormal results are displayed)  Labs Reviewed  BASIC METABOLIC PANEL - Abnormal; Notable for the following components:      Result Value   CO2 21 (*)    BUN 30 (*)    Creatinine, Ser 2.03 (*)    GFR calc non Af Amer 33 (*)    GFR calc Af Amer 38 (*)    All other components within normal limits  HEMOGLOBIN A1C - Abnormal; Notable for the following components:   Hgb A1c MFr Bld 6.0 (*)    All other components within normal limits  CBC   GLUCOSE, CAPILLARY     IMAGES:   EKG: 04/13/19 Rate 87 bpm Normal sinus rhythm  Normal ECG   CV:  Past Medical History:  Diagnosis Date  . BPH (benign prostatic hyperplasia)   . Cancer ([place]) 11/2018   prostate   . Chronic kidney disease    stage 1 ckd sees [place] [facility] dr Carmina Miller every 4 months  . Diabetes mellitus without complication ([place])    type 2  . Elevated cholesterol   . Foley catheter in place    last changed jan 2021  . Gout   . Hypertension     Past Surgical History:  Procedure Laterality Date  . COLONOSCOPY    . NO PAST SURGERIES      MEDICATIONS: . amLODipine (NORVASC) 10 MG tablet  . finasteride (PROSCAR) 5 MG tablet  . glimepiride (AMARYL) 4 MG tablet  . hydrochlorothiazide (HYDRODIURIL) 25 MG tablet  . metoprolol succinate (TOPROL XL) 50 MG 24 hr tablet  . rosuvastatin (CRESTOR) 40 MG tablet  . tamsulosin (FLOMAX) 0.4 MG CAPS capsule   No current facility-administered medications for this encounter.   Derrill Memo ON 04/15/2019] gentamicin (GARAMYCIN) 420 mg in dextrose 5 % 100 mL IVPB     Konrad Felix, PA-C [facility] Pre-Surgical Testing (  [phone] [id number] 04/14/19  1:30 PM

## 2019-04-14 NOTE — Anesthesia Preprocedure Evaluation (Addendum)
Anesthesia Evaluation  Patient identified by MRN, date of birth, ID band Patient awake    Reviewed: Allergy & Precautions, NPO status , Patient's Chart, lab work & pertinent test results, reviewed documented beta blocker date and time   History of Anesthesia Complications Negative for: history of anesthetic complications  Airway Mallampati: II  TM Distance: >3 FB Neck ROM: Full    Dental  (+) Dental Advisory Given, Edentulous Upper, Edentulous Lower   Pulmonary former smoker,    Pulmonary exam normal breath sounds clear to auscultation       Cardiovascular hypertension, Pt. on medications and Pt. on home beta blockers Normal cardiovascular exam Rhythm:Regular Rate:Normal     Neuro/Psych negative neurological ROS  negative psych ROS   GI/Hepatic negative GI ROS, Neg liver ROS,   Endo/Other  diabetes, Type 2, Oral Hypoglycemic AgentsObesity   Renal/GU Renal InsufficiencyRenal disease   BENIGN PROSTATE HYPERPLASIA WITH RETENTION  PROSTATE CANCER    Musculoskeletal negative musculoskeletal ROS (+)   Abdominal   Peds  Hematology negative hematology ROS (+)   Anesthesia Other Findings Day of surgery medications reviewed with the patient.  Reproductive/Obstetrics                           Anesthesia Physical Anesthesia Plan  ASA: II  Anesthesia Plan: General   Post-op Pain Management:    Induction: Intravenous  PONV Risk Score and Plan: 3 and Midazolam, Dexamethasone and Ondansetron  Airway Management Planned: LMA  Additional Equipment:   Intra-op Plan:   Post-operative Plan: Extubation in OR  Informed Consent: I have reviewed the patients History and Physical, chart, labs and discussed the procedure including the risks, benefits and alternatives for the proposed anesthesia with the patient or authorized representative who has indicated his/her understanding and acceptance.      Dental advisory given  Plan Discussed with: CRNA  Anesthesia Plan Comments:      Anesthesia Quick Evaluation

## 2019-04-15 ENCOUNTER — Observation Stay (HOSPITAL_COMMUNITY)
Admission: RE | Admit: 2019-04-15 | Discharge: 2019-04-16 | Disposition: A | Discharge status: Home / Self Care | Payer: 59 | Attending: Urology | Admitting: Urology

## 2019-04-15 ENCOUNTER — Ambulatory Visit (HOSPITAL_COMMUNITY): Payer: 59 | Admitting: Anesthesiology

## 2019-04-15 ENCOUNTER — Ambulatory Visit (HOSPITAL_COMMUNITY): Payer: 59 | Admitting: Physician Assistant

## 2019-04-15 ENCOUNTER — Encounter (HOSPITAL_COMMUNITY)
Admission: RE | Disposition: A | Discharge status: Home / Self Care | Payer: Self-pay | Source: Home / Self Care | Attending: Urology

## 2019-04-15 ENCOUNTER — Other Ambulatory Visit: Payer: Self-pay

## 2019-04-15 ENCOUNTER — Ambulatory Visit (HOSPITAL_COMMUNITY)
Admission: RE | Admit: 2019-04-15 | Discharge: 2019-04-15 | Disposition: A | Discharge status: Home / Self Care | Payer: 59 | Source: Ambulatory Visit | Attending: Urology | Admitting: Urology

## 2019-04-15 ENCOUNTER — Encounter (HOSPITAL_COMMUNITY): Payer: Self-pay | Admitting: Urology

## 2019-04-15 DIAGNOSIS — N401 Enlarged prostate with lower urinary tract symptoms: Principal | ICD-10-CM | POA: Insufficient documentation

## 2019-04-15 DIAGNOSIS — C61 Malignant neoplasm of prostate: Secondary | ICD-10-CM

## 2019-04-15 DIAGNOSIS — R338 Other retention of urine: Secondary | ICD-10-CM | POA: Insufficient documentation

## 2019-04-15 DIAGNOSIS — Z6830 Body mass index (BMI) 30.0-30.9, adult: Secondary | ICD-10-CM | POA: Insufficient documentation

## 2019-04-15 DIAGNOSIS — Z7984 Long term (current) use of oral hypoglycemic drugs: Secondary | ICD-10-CM | POA: Diagnosis not present

## 2019-04-15 DIAGNOSIS — E119 Type 2 diabetes mellitus without complications: Secondary | ICD-10-CM | POA: Diagnosis not present

## 2019-04-15 DIAGNOSIS — I10 Essential (primary) hypertension: Secondary | ICD-10-CM | POA: Diagnosis not present

## 2019-04-15 DIAGNOSIS — Z87891 Personal history of nicotine dependence: Secondary | ICD-10-CM | POA: Insufficient documentation

## 2019-04-15 DIAGNOSIS — Z79899 Other long term (current) drug therapy: Secondary | ICD-10-CM | POA: Diagnosis not present

## 2019-04-15 DIAGNOSIS — E669 Obesity, unspecified: Secondary | ICD-10-CM | POA: Diagnosis not present

## 2019-04-15 HISTORY — PX: PROSTATE BIOPSY: SHX241

## 2019-04-15 HISTORY — PX: TRANSURETHRAL RESECTION OF PROSTATE: SHX73

## 2019-04-15 LAB — GLUCOSE, CAPILLARY
Glucose-Capillary: 136 mg/dL — ABNORMAL HIGH (ref 70–99)
Glucose-Capillary: 139 mg/dL — ABNORMAL HIGH (ref 70–99)
Glucose-Capillary: 151 mg/dL — ABNORMAL HIGH (ref 70–99)
Glucose-Capillary: 165 mg/dL — ABNORMAL HIGH (ref 70–99)

## 2019-04-15 LAB — HIV ANTIBODY (ROUTINE TESTING W REFLEX): HIV Screen 4th Generation wRfx: NONREACTIVE

## 2019-04-15 SURGERY — BIOPSY, PROSTATE, RECTAL APPROACH, WITH US GUIDANCE
Anesthesia: General

## 2019-04-15 MED ORDER — ONDANSETRON HCL 4 MG/2ML IJ SOLN: 4.0000 mg | Freq: Once | INTRAMUSCULAR | Status: DC | PRN

## 2019-04-15 MED ORDER — ONDANSETRON HCL 4 MG/2ML IJ SOLN
INTRAMUSCULAR | Status: DC | PRN
  Administered 2019-04-15: 4 mg via INTRAVENOUS

## 2019-04-15 MED ORDER — SODIUM CHLORIDE 0.9 % IV SOLN
1.0000 g | INTRAVENOUS | Status: DC
  Administered 2019-04-16: 1 g via INTRAVENOUS
  Filled 2019-04-15: qty 1

## 2019-04-15 MED ORDER — FLEET ENEMA 7-19 GM/118ML RE ENEM: 1.0000 | ENEMA | Freq: Once | RECTAL | Status: DC | PRN

## 2019-04-15 MED ORDER — PROPOFOL 10 MG/ML IV BOLUS
INTRAVENOUS | Status: DC | PRN
  Administered 2019-04-15: 60 mg via INTRAVENOUS
  Administered 2019-04-15: 110 mg via INTRAVENOUS

## 2019-04-15 MED ORDER — ACETAMINOPHEN 500 MG PO TABS
1000.0000 mg | ORAL_TABLET | Freq: Once | ORAL | Status: AC
  Administered 2019-04-15: 11:00:00 1000 mg via ORAL
  Filled 2019-04-15: qty 2

## 2019-04-15 MED ORDER — FENTANYL CITRATE (PF) 100 MCG/2ML IJ SOLN
INTRAMUSCULAR | Status: AC
  Filled 2019-04-15: qty 2

## 2019-04-15 MED ORDER — MIDAZOLAM HCL 2 MG/2ML IJ SOLN
INTRAMUSCULAR | Status: AC
  Filled 2019-04-15: qty 2

## 2019-04-15 MED ORDER — ROSUVASTATIN CALCIUM 20 MG PO TABS
40.0000 mg | ORAL_TABLET | Freq: Every day | ORAL | Status: DC
  Administered 2019-04-16: 40 mg via ORAL
  Filled 2019-04-15: qty 2

## 2019-04-15 MED ORDER — MIDAZOLAM HCL 5 MG/5ML IJ SOLN
INTRAMUSCULAR | Status: DC | PRN
  Administered 2019-04-15: 2 mg via INTRAVENOUS

## 2019-04-15 MED ORDER — DEXAMETHASONE SODIUM PHOSPHATE 10 MG/ML IJ SOLN
INTRAMUSCULAR | Status: AC
  Filled 2019-04-15: qty 1

## 2019-04-15 MED ORDER — SENNOSIDES-DOCUSATE SODIUM 8.6-50 MG PO TABS: 1.0000 | ORAL_TABLET | Freq: Every evening | ORAL | Status: DC | PRN

## 2019-04-15 MED ORDER — LIDOCAINE 2% (20 MG/ML) 5 ML SYRINGE
INTRAMUSCULAR | Status: AC
  Filled 2019-04-15: qty 5

## 2019-04-15 MED ORDER — DEXAMETHASONE SODIUM PHOSPHATE 10 MG/ML IJ SOLN
INTRAMUSCULAR | Status: DC | PRN
  Administered 2019-04-15: 10 mg via INTRAVENOUS

## 2019-04-15 MED ORDER — ACETAMINOPHEN 325 MG PO TABS: 650.0000 mg | ORAL_TABLET | ORAL | Status: DC | PRN

## 2019-04-15 MED ORDER — HYDROCHLOROTHIAZIDE 25 MG PO TABS
25.0000 mg | ORAL_TABLET | Freq: Every day | ORAL | Status: DC
  Administered 2019-04-16: 11:00:00 25 mg via ORAL
  Filled 2019-04-15: qty 1

## 2019-04-15 MED ORDER — INSULIN ASPART 100 UNIT/ML ~~LOC~~ SOLN
0.0000 [IU] | Freq: Three times a day (TID) | SUBCUTANEOUS | Status: DC
  Administered 2019-04-15: 19:00:00 3 [IU] via SUBCUTANEOUS

## 2019-04-15 MED ORDER — METOPROLOL SUCCINATE ER 50 MG PO TB24
50.0000 mg | ORAL_TABLET | Freq: Every day | ORAL | Status: DC
  Administered 2019-04-16: 50 mg via ORAL
  Filled 2019-04-15: qty 1

## 2019-04-15 MED ORDER — LIDOCAINE 2% (20 MG/ML) 5 ML SYRINGE
INTRAMUSCULAR | Status: DC | PRN
  Administered 2019-04-15: 80 mg via INTRAVENOUS

## 2019-04-15 MED ORDER — OXYCODONE HCL 5 MG PO TABS: 5.0000 mg | ORAL_TABLET | ORAL | Status: DC | PRN

## 2019-04-15 MED ORDER — SODIUM CHLORIDE 0.9 % IV SOLN
2.0000 g | INTRAVENOUS | Status: AC
  Administered 2019-04-15: 2 g via INTRAVENOUS
  Filled 2019-04-15: qty 20

## 2019-04-15 MED ORDER — 0.9 % SODIUM CHLORIDE (POUR BTL) OPTIME
TOPICAL | Status: DC | PRN
  Administered 2019-04-15: 1000 mL

## 2019-04-15 MED ORDER — SODIUM CHLORIDE 0.9 % IR SOLN
3000.0000 mL | Status: DC
  Administered 2019-04-15 – 2019-04-16 (×3): 3000 mL

## 2019-04-15 MED ORDER — SODIUM CHLORIDE 0.9 % IR SOLN
Status: DC | PRN
  Administered 2019-04-15: 21000 mL via INTRAVESICAL

## 2019-04-15 MED ORDER — ONDANSETRON HCL 4 MG/2ML IJ SOLN: 4.0000 mg | INTRAMUSCULAR | Status: DC | PRN

## 2019-04-15 MED ORDER — BISACODYL 10 MG RE SUPP: 10.0000 mg | Freq: Every day | RECTAL | Status: DC | PRN

## 2019-04-15 MED ORDER — FENTANYL CITRATE (PF) 100 MCG/2ML IJ SOLN
INTRAMUSCULAR | Status: DC | PRN
  Administered 2019-04-15: 25 ug via INTRAVENOUS
  Administered 2019-04-15: 50 ug via INTRAVENOUS
  Administered 2019-04-15 (×2): 25 ug via INTRAVENOUS
  Administered 2019-04-15: 50 ug via INTRAVENOUS
  Administered 2019-04-15: 25 ug via INTRAVENOUS

## 2019-04-15 MED ORDER — HYOSCYAMINE SULFATE 0.125 MG SL SUBL
0.1250 mg | SUBLINGUAL_TABLET | SUBLINGUAL | Status: DC | PRN
  Filled 2019-04-15: qty 1

## 2019-04-15 MED ORDER — LACTATED RINGERS IV SOLN: INTRAVENOUS | Status: DC

## 2019-04-15 MED ORDER — FINASTERIDE 5 MG PO TABS
5.0000 mg | ORAL_TABLET | Freq: Every day | ORAL | Status: DC
  Administered 2019-04-16: 5 mg via ORAL
  Filled 2019-04-15: qty 1

## 2019-04-15 MED ORDER — FENTANYL CITRATE (PF) 100 MCG/2ML IJ SOLN: 25.0000 ug | INTRAMUSCULAR | Status: DC | PRN

## 2019-04-15 MED ORDER — POTASSIUM CHLORIDE IN NACL 20-0.45 MEQ/L-% IV SOLN
INTRAVENOUS | Status: DC
  Filled 2019-04-15 (×3): qty 1000

## 2019-04-15 MED ORDER — HYDROMORPHONE HCL 1 MG/ML IJ SOLN: 0.5000 mg | INTRAMUSCULAR | Status: DC | PRN

## 2019-04-15 MED ORDER — AMLODIPINE BESYLATE 10 MG PO TABS
10.0000 mg | ORAL_TABLET | Freq: Every day | ORAL | Status: DC
  Administered 2019-04-16: 11:00:00 10 mg via ORAL
  Filled 2019-04-15: qty 1

## 2019-04-15 MED ORDER — PHENYLEPHRINE 40 MCG/ML (10ML) SYRINGE FOR IV PUSH (FOR BLOOD PRESSURE SUPPORT)
PREFILLED_SYRINGE | INTRAVENOUS | Status: DC | PRN
  Administered 2019-04-15: 80 ug via INTRAVENOUS
  Administered 2019-04-15 (×2): 120 ug via INTRAVENOUS

## 2019-04-15 SURGICAL SUPPLY — 29 items
BAG URINE DRAIN 2000ML AR STRL (UROLOGICAL SUPPLIES) IMPLANT
BAG URO CATCHER STRL LF (MISCELLANEOUS) ×3 IMPLANT
CATH FOLEY 3WAY 30CC 22FR (CATHETERS) ×3 IMPLANT
COVER WAND RF STERILE (DRAPES) IMPLANT
DRSG TELFA 3X8 NADH (GAUZE/BANDAGES/DRESSINGS) IMPLANT
ELECT REM PT RETURN 15FT ADLT (MISCELLANEOUS) ×3 IMPLANT
GLOVE SURG SS PI 8.0 STRL IVOR (GLOVE) ×3 IMPLANT
GOWN STRL REUS W/TWL XL LVL3 (GOWN DISPOSABLE) ×3 IMPLANT
HOLDER FOLEY CATH W/STRAP (MISCELLANEOUS) ×3 IMPLANT
INST BIOPSY MAXCORE 18GX25 (NEEDLE) ×3 IMPLANT
INSTR BIOPSY MAXCORE 18GX20 (NEEDLE) IMPLANT
KIT TURNOVER KIT A (KITS) IMPLANT
LOOP CUT BIPOLAR 24F LRG (ELECTROSURGICAL) ×6 IMPLANT
MANIFOLD NEPTUNE II (INSTRUMENTS) ×3 IMPLANT
NDL SAFETY ECLIPSE 18X1.5 (NEEDLE) IMPLANT
NEEDLE HYPO 18GX1.5 SHARP (NEEDLE)
NEEDLE SPNL 22GX7 QUINCKE BK (NEEDLE) IMPLANT
PACK CYSTO (CUSTOM PROCEDURE TRAY) ×3 IMPLANT
PENCIL SMOKE EVACUATOR (MISCELLANEOUS) IMPLANT
SURGILUBE 2OZ TUBE FLIPTOP (MISCELLANEOUS) ×3 IMPLANT
SYR 30ML LL (SYRINGE) ×3 IMPLANT
SYR CONTROL 10ML LL (SYRINGE) IMPLANT
SYR TOOMEY IRRIG 70ML (MISCELLANEOUS)
SYRINGE TOOMEY IRRIG 70ML (MISCELLANEOUS) IMPLANT
TOWEL OR 17X26 10 PK STRL BLUE (TOWEL DISPOSABLE) IMPLANT
TUBING CONNECTING 10 (TUBING) ×2 IMPLANT
TUBING CONNECTING 10' (TUBING) ×1
TUBING UROLOGY SET (TUBING) ×3 IMPLANT
UNDERPAD 30X36 HEAVY ABSORB (UNDERPADS AND DIAPERS) ×3 IMPLANT

## 2019-04-15 NOTE — Interval H&P Note (Signed)
History and Physical Interval Note:  04/15/2019 11:16 AM  David Bonilla  has presented today for surgery, with the diagnosis of BENIGN PROSTATE HYPERPLASIA WITH RETENTION  PROSTATE CANCER.  The various methods of treatment have been discussed with the patient and family. After consideration of risks, benefits and other options for treatment, the patient has consented to  Procedure(s): BIOPSY TRANSRECTAL ULTRASONIC PROSTATE (TUBP) (N/A) TRANSURETHRAL RESECTION OF THE PROSTATE (TURP) (N/A) as a surgical intervention.  The patient's history has been reviewed, patient examined, no change in status, stable for surgery.  I have reviewed the patient's chart and labs.  Questions were answered to the patient's satisfaction.     Irine Seal

## 2019-04-15 NOTE — Anesthesia Procedure Notes (Signed)
Procedure Name: LMA Insertion Date/Time: 04/15/2019 1:00 PM Performed by: Sharlette Dense, CRNA Patient Re-evaluated:Patient Re-evaluated prior to induction Oxygen Delivery Method: Circle system utilized Preoxygenation: Pre-oxygenation with 100% oxygen Induction Type: IV induction Ventilation: Mask ventilation without difficulty LMA: LMA inserted LMA Size: 5.0 Number of attempts: 2 Placement Confirmation: positive ETCO2 and breath sounds checked- equal and bilateral Tube secured with: Tape Dental Injury: Teeth and Oropharynx as per pre-operative assessment

## 2019-04-15 NOTE — Plan of Care (Signed)
  Problem: Bowel/Gastric: Goal: Gastrointestinal status for postoperative course will improve Outcome: Progressing   Problem: Urinary Elimination: Goal: Ability to avoid or minimize complications of infection will improve Outcome: Progressing   Problem: Education: Goal: Knowledge of General Education information will improve Description: Including pain rating scale, medication(s)/side effects and non-pharmacologic comfort measures Outcome: Completed/Met   Problem: Activity: Goal: Risk for activity intolerance will decrease Outcome: Completed/Met   Problem: Pain Managment: Goal: General experience of comfort will improve Outcome: Completed/Met   Problem: Skin Integrity: Goal: Demonstration of wound healing without infection will improve Outcome: Completed/Met

## 2019-04-15 NOTE — Discharge Instructions (Signed)
Transurethral Resection of the Prostate, Care After This sheet gives you information about how to care for yourself after your procedure. Your health care provider may also give you more specific instructions. If you have problems or questions, contact your health care provider. What can I expect after the procedure? After the procedure, it is common to have:  Mild pain in your lower abdomen.  Soreness or mild discomfort in your penis from having the catheter inserted during the procedure.  A feeling of urgency when you need to urinate.  A small amount of blood in your urine. You may notice some small blood clots in your urine. These are normal. Follow these instructions at home: Medicines  Take over-the-counter and prescription medicines only as told by your health care provider.  If you were prescribed an antibiotic medicine, take it as told by your health care provider. Do not stop taking the antibiotic even if you start to feel better.  Ask your health care provider if the medicine prescribed to you: ? Requires you to avoid driving or using heavy machinery. ? Can cause constipation. You may need to take actions to prevent or treat constipation, such as:  Take over-the-counter or prescription medicines.  Eat foods that are high in fiber, such as fresh fruits and vegetables, whole grains, and beans.  Limit foods that are high in fat and processed sugars, such as fried or sweet foods.  Do not drive for 24 hours if you were given a sedative during your procedure. Activity   Return to your normal activities as told by your health care provider. Ask your health care provider what activities are safe for you.  Do not lift anything that is heavier than 10 lb (4.5 kg), or the limit that you are told, for 3 weeks after the procedure or until your health care provider says that it is safe.  Avoid intense physical activity for as long as told by your health care provider.  Avoid  sitting for a long time without moving. Get up and move around one or more times every few hours. This helps to prevent blood clots. You may increase your physical activity gradually as you start to feel better. Lifestyle  Do not drink alcohol for as long as told by your health care provider. This is especially important if you are taking prescription pain medicines.  Do not engage in sexual activity until your health care provider says that you can do this. General instructions   Do not take baths, swim, or use a hot tub until your health care provider approves.  Drink enough fluid to keep your urine pale yellow.  Urinate as soon as you feel the need to. Do not try to hold your urine for long periods of time.  If your health care provider approves, you may take a stool softener for 2-3 weeks to prevent you from straining to have a bowel movement.  Wear compression stockings as told by your health care provider. These stockings help to prevent blood clots and reduce swelling in your legs.  Keep all follow-up visits as told by your health care provider. This is important. Contact a health care provider if you have:  Difficulty urinating.  A fever.  Pain that gets worse or does not improve with medicine.  Blood in your urine that does not go away after 1 week of resting and drinking more fluids.  Swelling in your penis or testicles. Get help right away if:  You are unable   to urinate.  You are having more blood clots in your urine instead of fewer.  You have: ? Large blood clots. ? A lot of blood in your urine. ? Pain in your back or lower abdomen. ? Pain or swelling in your legs. ? Chills and you are shaking. ? Difficulty breathing or shortness of breath. Summary  After the procedure, it is common to have a small amount of blood in your urine.  Avoid heavy lifting and intense physical activity for as long as told by your health care provider.  Urinate as soon as you  feel the need to. Do not try to hold your urine for long periods of time.  Keep all follow-up visits as told by your health care provider. This is important. This information is not intended to replace advice given to you by your health care provider. Make sure you discuss any questions you have with your health care provider. Document Revised: 05/27/2018 Document Reviewed: 11/05/2017 Elsevier Patient Education  2020 [employer]  

## 2019-04-15 NOTE — Transfer of Care (Signed)
Immediate Anesthesia Transfer of Care Note  Patient: David Bonilla  Procedure(s) Performed: BIOPSY TRANSRECTAL ULTRASONIC PROSTATE (TUBP) (N/A ) TRANSURETHRAL RESECTION OF THE PROSTATE (TURP) (N/A )  Patient Location: PACU  Anesthesia Type:General  Level of Consciousness: awake  Airway & Oxygen Therapy: Patient Spontanous Breathing and Patient connected to face mask oxygen  Post-op Assessment: Report given to RN and Post -op Vital signs reviewed and stable  Post vital signs: Reviewed and stable  Last Vitals:  Vitals Value Taken Time  BP    Temp    Pulse 76 04/15/19 1431  Resp    SpO2 97 % 04/15/19 1431  Vitals shown include unvalidated device data.  Last Pain:  Vitals:   04/15/19 1110  TempSrc:   PainSc: 0-No pain         Complications: No apparent anesthesia complications

## 2019-04-15 NOTE — Op Note (Signed)
Procedure: 1.  Prostate ultrasound. 2.  Ultrasound-guided prostate biopsy. 3.  Transurethral resection of the prostate.  Preop diagnosis: Prostate cancer with urinary retention.  Postop diagnosis: Same.  Surgeon: Dr. Irine Seal.  Anesthesia: General.  Specimen: 12 core prostate biopsy and prostate resection chips.  EBL: Approximately 200 mL.  Drains: 22 French three-way Foley catheter.  Complications: None.  Indications: David Bonilla is a 69 year old male with a history of very low risk prostate cancer and BPH with bladder outlet obstruction who developed urinary retention despite finasteride and tamsulosin.  He is to undergo prostate biopsy for surveillance and transurethral resection today.  Procedure: He was taken the operating room and given gentamicin per pharmacy and 2 g of Rocephin.  He was placed in lithotomy position after induction of general anesthetic and was fitted with PAS hose.  A 10 MHz transrectal ultrasound probe was assembled and inserted and scanning was performed.  The ultrasound demonstrated a 98 mL prostate with a large asymmetric middle lobe with intravesical protrusion.  The right lobe is more prominent than the left.  No significant hypoechoic lesions were identified.  The seminal vesicles were unremarkable.  After completion of the diagnostic scan a 12 core prostate biopsy was obtained in the usual configuration.  There was minimal bleeding.  The probe was removed.  His perineum and genitalia were prepped with Betadine solution he was draped in usual sterile fashion.  Urethra was calibrated to 30 [place] with [employer] sounds and the 26 French continuous-flow resectoscope sheath was inserted.  This was fitted with a Beatrix Fetters handle with a bipolar loop and 30 degree lens.  Saline was used as the irrigant.  Inspection demonstrated a normal urethra.  The external sphincter was intact.  The prostatic urethra had bilobar hyperplasia with asymmetric enlargement of the right  lobe with intravesical extension.  Ureteral orifices were well away from the bladder neck.  The bladder wall had some catheter irritation but no other mucosal lesions.  No stones were seen.  There was moderate trabeculation.  The prostate resection was initiated by resecting the bladder neck fibers from 5-7 o'clock.  The resection was carried out to the verumontanum along the floor.  The right lobe of the prostate was then resected from bladder neck to apex at approximately 10:00 in the resection was then carried down onto the floor the prostate.  Additional anterior tissue including intravesical protrusion were resected followed by residual apical tissue.  Hemostasis was achieved and resection of the right lobe was then performed in a similar fashion.  Once an adequate resection had been performed the bladder was evacuated free of chips.  Some additional anterior tissue and right lateral tissue was resected.  Those chips were removed and final hemostasis was obtained.  Final inspection revealed no retained chips, no active bleeding, intact ureteral orifices and an intact external sphincter.  The scope was removed and pressure on the bladder produced an excellent stream.  A 22 French three-way Foley catheter was inserted with the aid of a catheter guide.  The balloon was filled with 30 mL of sterile fluid.  The catheter was hand irrigated with clear return in place to continuous irrigation and straight drainage.  He was taken down from lithotomy position, his anesthetic was reversed and he was moved recovery in stable condition.  There were no complications.

## 2019-04-15 NOTE — Progress Notes (Signed)
Patient ID: David Bonilla, male   DOB: January 29, 1951, 69 y.o.   MRN: [id number]  He is doing well post op with clear urine and no pain.  .BP 120/70 (BP Location: Right Arm)   Pulse 66   Temp 97.6 F (36.4 C) (Oral)   Resp 16   Ht 5\' 10"  (1.778 m)   Wt 97.5 kg   SpO2 99%   BMI 30.85 kg/m   I will have the foley removed at 5 am if clear and then send him home when voiding.

## 2019-04-16 ENCOUNTER — Encounter: Payer: Self-pay | Admitting: *Deleted

## 2019-04-16 DIAGNOSIS — N401 Enlarged prostate with lower urinary tract symptoms: Secondary | ICD-10-CM | POA: Diagnosis not present

## 2019-04-16 LAB — GLUCOSE, CAPILLARY
Glucose-Capillary: 155 mg/dL — ABNORMAL HIGH (ref 70–99)
Glucose-Capillary: 172 mg/dL — ABNORMAL HIGH (ref 70–99)

## 2019-04-16 NOTE — Discharge Summary (Signed)
Patient ID: David Bonilla MRN: [id number] DOB/AGE: [birth year] 69 y.o.  Admit date: 04/15/2019 Discharge date: 04/16/2019  Primary Care Physician:  David Huxley, PA  Discharge Diagnoses:  BPH, prostate cancer   Consults:  None   Discharge Medications: Allergies as of 04/16/2019   No Known Allergies     Medication List    TAKE these medications   amLODipine 10 MG tablet Commonly known as: NORVASC Take 10 mg by mouth daily after breakfast.   finasteride 5 MG tablet Commonly known as: PROSCAR Take 5 mg by mouth daily after breakfast.   glimepiride 4 MG tablet Commonly known as: AMARYL Take 4 mg by mouth daily with breakfast.   hydrochlorothiazide 25 MG tablet Commonly known as: HYDRODIURIL Take 25 mg by mouth daily after breakfast.   rosuvastatin 40 MG tablet Commonly known as: CRESTOR Take 40 mg by mouth daily after breakfast.   Toprol XL 50 MG 24 hr tablet Generic drug: metoprolol succinate Take 50 mg by mouth daily after breakfast.        Significant Diagnostic Studies:  [place] Intraoperative  Result Date: 04/15/2019 CLINICAL DATA:  Ultrasound was provided for use by the ordering physician, and a technical charge was applied by the performing facility.  No radiologist interpretation/professional services rendered.    Brief H and P: For complete details please refer to admission H and P, but in brief pt admitted for postop mgmt following TURP/TRUS/Bx.  Hospital Course: Unremarkable postop course Active Problems:   Prostate cancer ([name])   Day of Discharge BP (!) 126/98 (BP Location: Right Arm)   Pulse 73   Temp 97.8 F (36.6 C) (Oral)   Resp 16   Ht 5\' 10"  (1.778 m)   Wt 97.5 kg   SpO2 96%   BMI 30.85 kg/m   Results for orders placed or performed during the hospital encounter of 04/15/19 (from the past 24 hour(s))  Glucose, capillary     Status: Abnormal   Collection Time: 04/15/19 10:33 AM  Result Value Ref Range   Glucose-Capillary 136 (H)  70 - 99 mg/dL  HIV Antibody (routine testing w rflx)     Status: None   Collection Time: 04/15/19  3:20 PM  Result Value Ref Range   HIV Screen 4th Generation wRfx NON REACTIVE NON REACTIVE  Glucose, capillary     Status: Abnormal   Collection Time: 04/15/19  3:56 PM  Result Value Ref Range   Glucose-Capillary 139 (H) 70 - 99 mg/dL  Glucose, capillary     Status: Abnormal   Collection Time: 04/15/19  5:18 PM  Result Value Ref Range   Glucose-Capillary 151 (H) 70 - 99 mg/dL  Glucose, capillary     Status: Abnormal   Collection Time: 04/15/19 11:30 PM  Result Value Ref Range   Glucose-Capillary 165 (H) 70 - 99 mg/dL    Physical Exam: General: Alert and awake oriented x3 not in any acute distress. HEENT: anicteric sclera, pupils reactive to light and accommodation CVS: S1-S2 clear no murmur rubs or gallops Chest: clear to auscultation bilaterally, no wheezing rales or rhonchi Abdomen: soft nontender, nondistended, normal bowel sounds, no organomegaly Extremities: no cyanosis, clubbing or edema noted bilaterally Neuro: Cranial nerves II-XII intact, no focal neurological deficits  Disposition:  Home  Diet:  Regular  Activity:  Discussed w/ pt    TESTS THAT NEED FOLLOW-UP   Path review  DISCHARGE FOLLOW-UP  Follow-up Information    David Kays, NP On 04/29/2019.   Specialty:  Nurse Practitioner Why: 10a Contact information: [address] [place] [address] [phone]           Time spent on Discharge:   10 mins  Signed: Lillette Boxer Vernetta Dizdarevic 04/16/2019, 7:13 AM

## 2019-04-16 NOTE — Anesthesia Postprocedure Evaluation (Signed)
Anesthesia Post Note  Patient: DAGIM TOUSIGNANT  Procedure(s) Performed: BIOPSY TRANSRECTAL ULTRASONIC PROSTATE (TUBP) (N/A ) TRANSURETHRAL RESECTION OF THE PROSTATE (TURP) (N/A )     Patient location during evaluation: PACU Anesthesia Type: General Level of consciousness: awake and alert Pain management: pain level controlled Vital Signs Assessment: post-procedure vital signs reviewed and stable Respiratory status: spontaneous breathing, nonlabored ventilation and respiratory function stable Cardiovascular status: blood pressure returned to baseline and stable Postop Assessment: no apparent nausea or vomiting Anesthetic complications: no    Last Vitals:  Vitals:   04/15/19 2326 04/16/19 0414  BP: 122/67 (!) 126/98  Pulse: 76 73  Resp: 16 16  Temp: 36.7 C 36.6 C  SpO2: 94% 96%    Last Pain:  Vitals:   04/16/19 0414  TempSrc: Oral  PainSc:                  Catalina Gravel

## 2019-04-20 LAB — SURGICAL PATHOLOGY

## 2020-01-13 IMAGING — US US RENAL
1 series · 14 of 25 positions shown · non-contrast
Comparison: None.

CLINICAL DATA: Chronic kidney disease

EXAM:
RENAL / URINARY TRACT ULTRASOUND COMPLETE

[Series 1: us renal · 0.23mm/px · 14 of 41 slices shown]
[im 1/41]
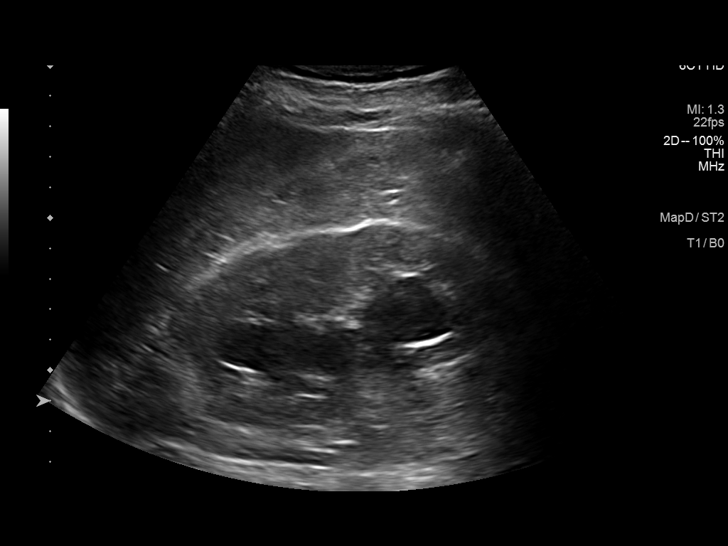
[im 4/41]
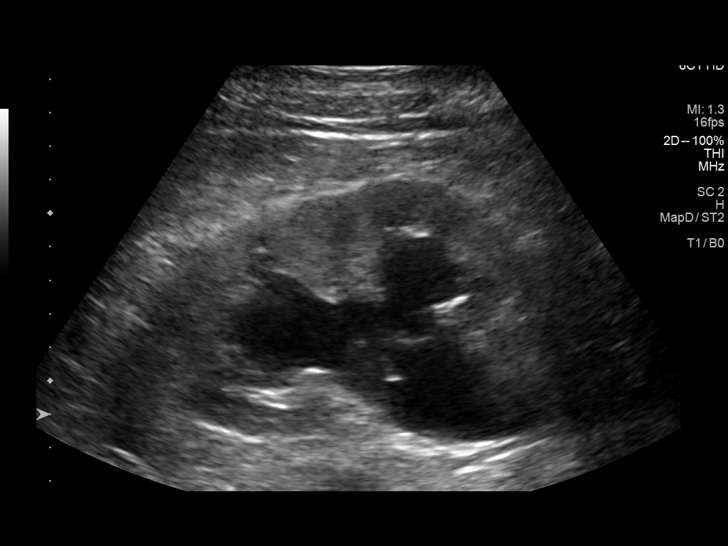
[im 7/41]
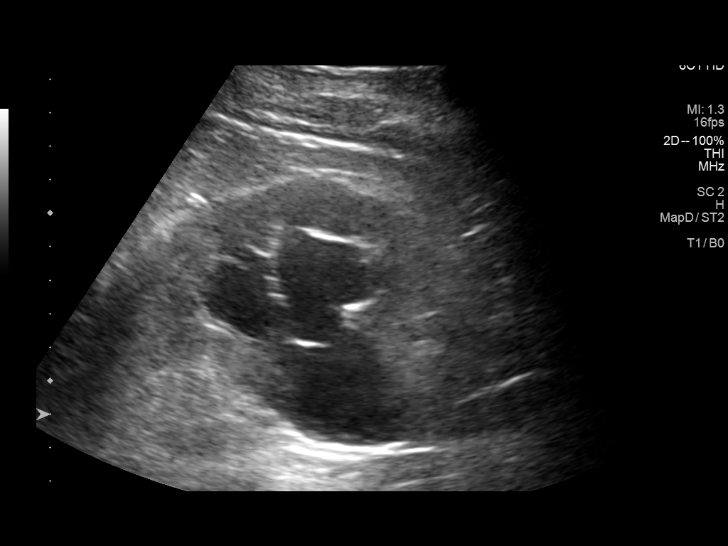
[im 11/41]
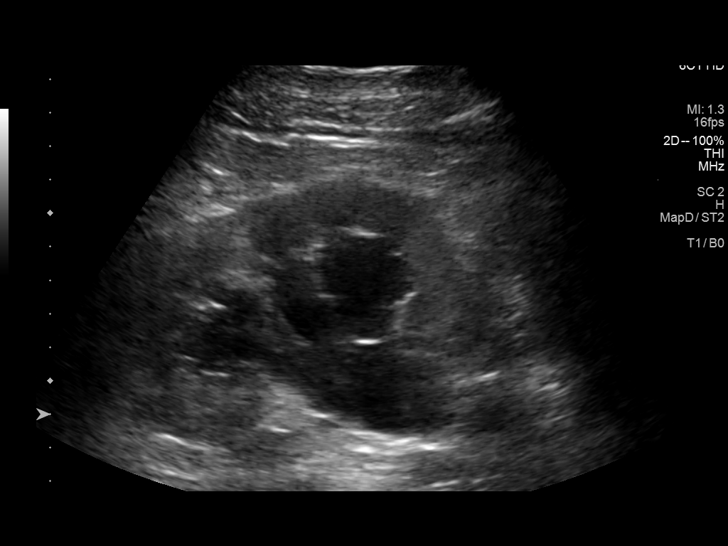
[im 14/41]
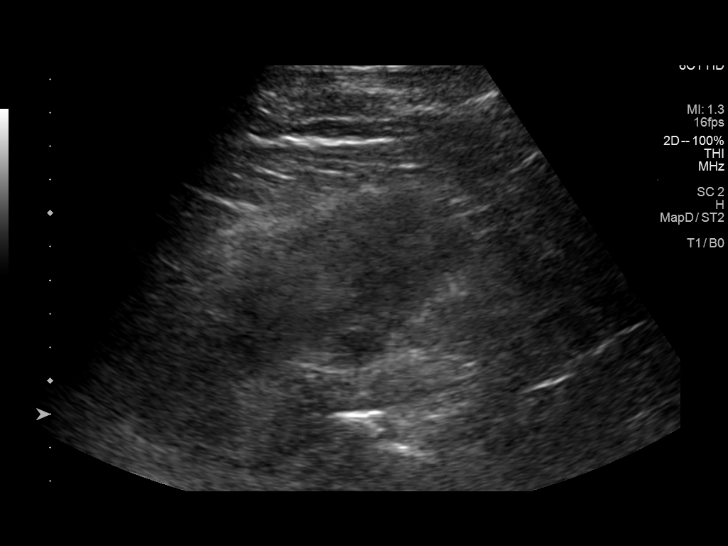
[im 16/41]
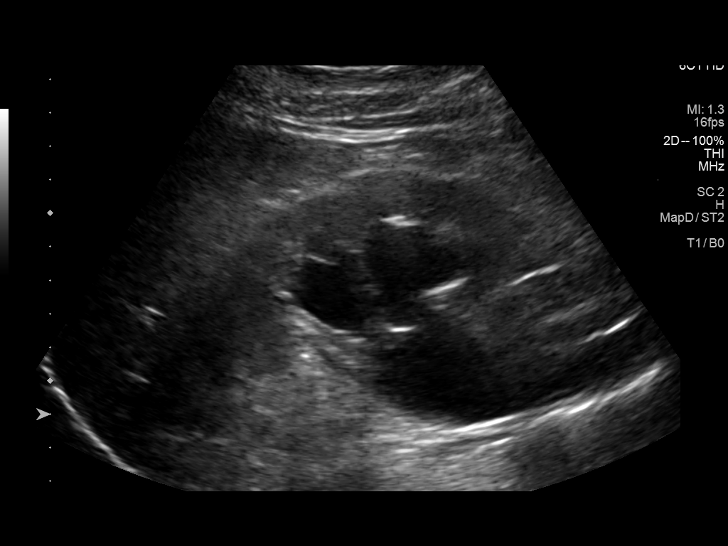
[im 19/41]
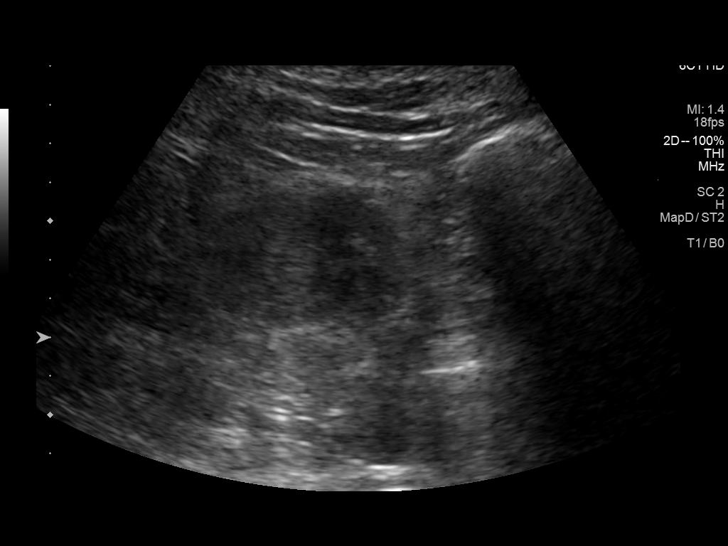
[im 22/41]
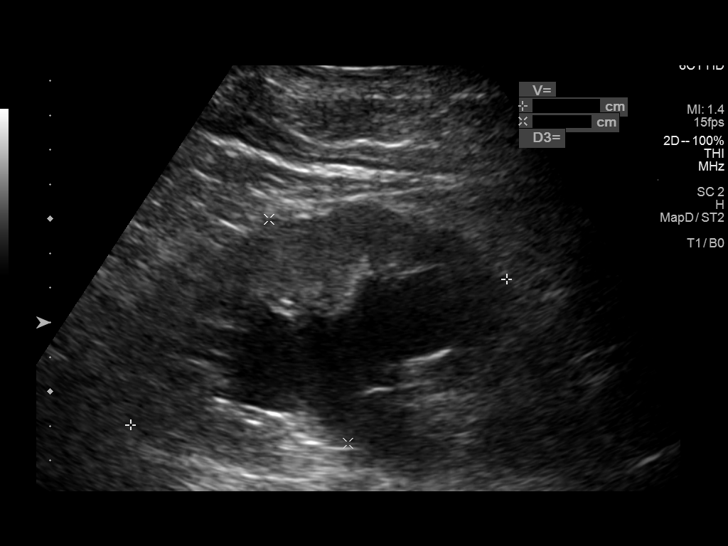
[im 26/41]
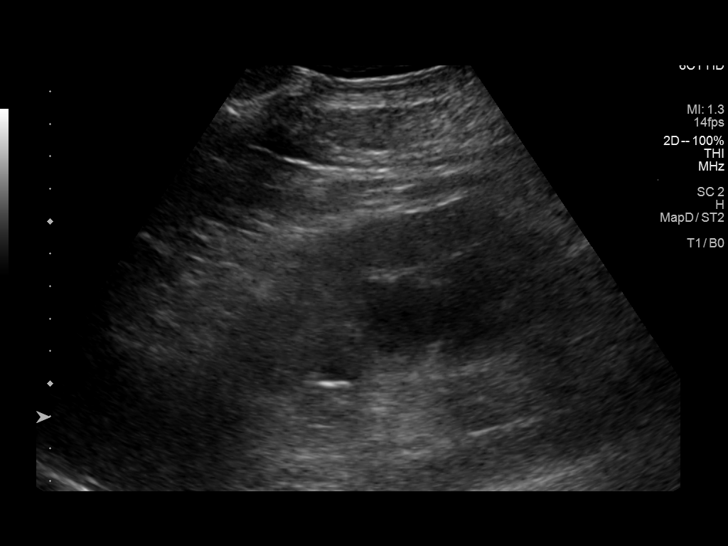
[im 27/41]
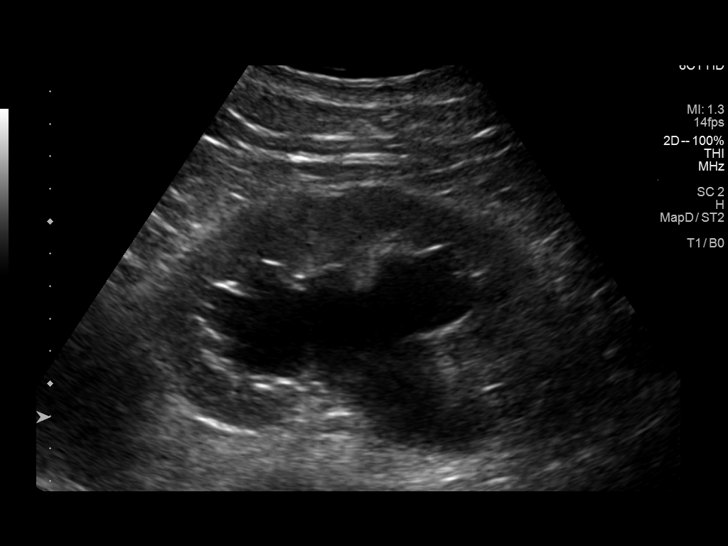
[im 31/41]
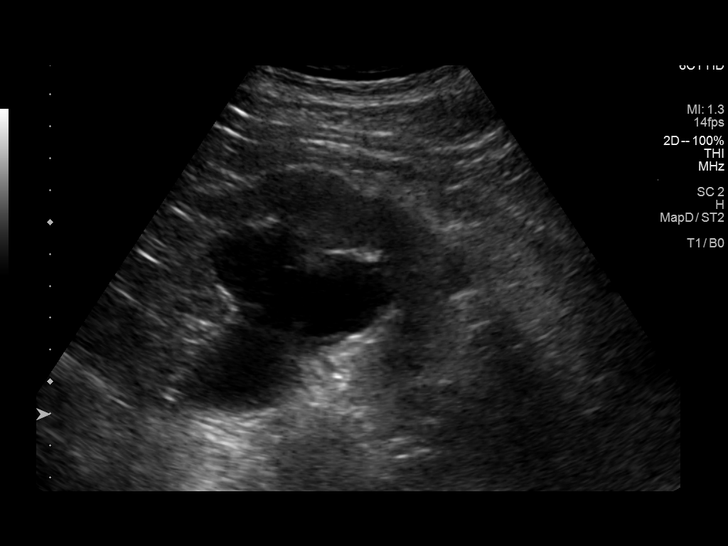
[im 34/41]
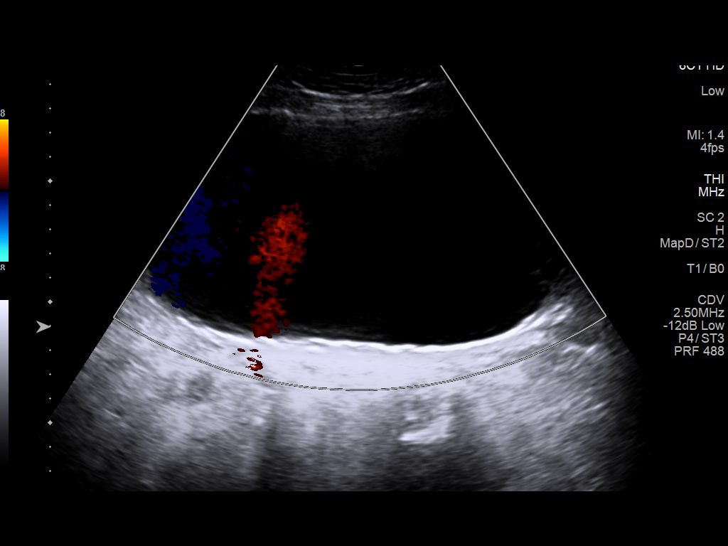
[im 37/41]
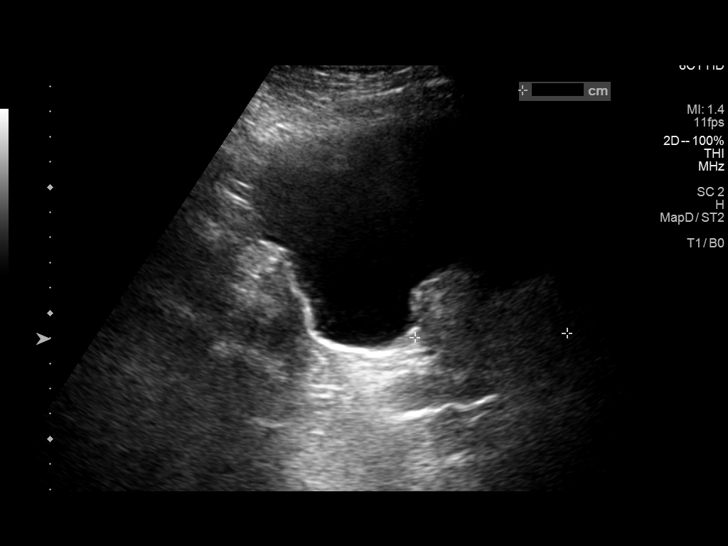
[im 41/41]
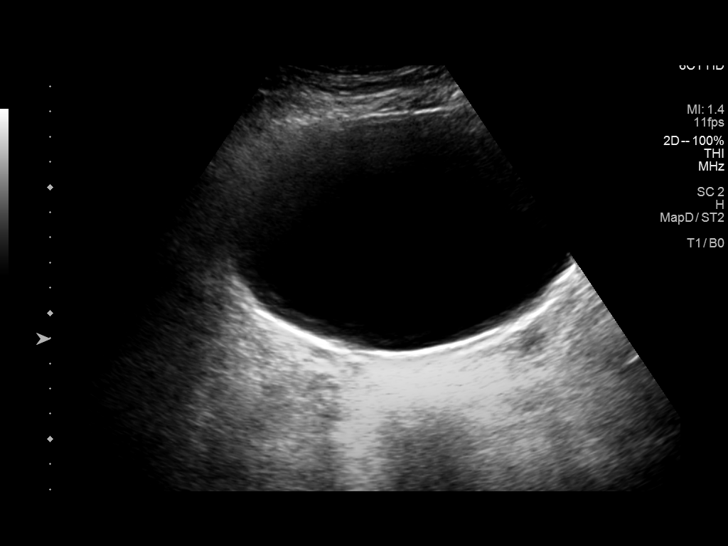

[14 of 25 positions shown; findings below may reference images not displayed]

FINDINGS: Right Kidney:

Renal measurements: 13.3 x 7.6 x 7.6 cm = volume: 397 mL. There is
moderate to severe right-sided hydroureteronephrosis.

Left Kidney:

Renal measurements: 11.7 x 6.9 x 6.6 cm = volume: 287 mL. There is
moderate severe left-sided hydroureteronephrosis.

Bladder:

The bladder is severely distended. The prostate gland is
significantly enlarged measuring 6 x 6.5 x 5.4 cm

Other:

None.
IMPRESSION: 1. Bilateral moderate to severe hydroureteronephrosis.
2. Significantly distended urinary bladder.
3. Prostatomegaly

## 2020-04-27 DIAGNOSIS — N1832 Chronic kidney disease, stage 3b: Secondary | ICD-10-CM | POA: Diagnosis not present

## 2020-05-01 DIAGNOSIS — N1832 Chronic kidney disease, stage 3b: Secondary | ICD-10-CM | POA: Diagnosis not present

## 2020-05-01 DIAGNOSIS — I129 Hypertensive chronic kidney disease with stage 1 through stage 4 chronic kidney disease, or unspecified chronic kidney disease: Secondary | ICD-10-CM | POA: Diagnosis not present

## 2020-05-01 DIAGNOSIS — N179 Acute kidney failure, unspecified: Secondary | ICD-10-CM | POA: Diagnosis not present

## 2020-05-13 DIAGNOSIS — M109 Gout, unspecified: Secondary | ICD-10-CM | POA: Diagnosis not present

## 2020-05-13 DIAGNOSIS — Z008 Encounter for other general examination: Secondary | ICD-10-CM | POA: Diagnosis not present

## 2020-05-13 DIAGNOSIS — Z809 Family history of malignant neoplasm, unspecified: Secondary | ICD-10-CM | POA: Diagnosis not present

## 2020-05-13 DIAGNOSIS — Z8249 Family history of ischemic heart disease and other diseases of the circulatory system: Secondary | ICD-10-CM | POA: Diagnosis not present

## 2020-05-13 DIAGNOSIS — N400 Enlarged prostate without lower urinary tract symptoms: Secondary | ICD-10-CM | POA: Diagnosis not present

## 2020-05-13 DIAGNOSIS — E669 Obesity, unspecified: Secondary | ICD-10-CM | POA: Diagnosis not present

## 2020-05-13 DIAGNOSIS — Z7984 Long term (current) use of oral hypoglycemic drugs: Secondary | ICD-10-CM | POA: Diagnosis not present

## 2020-05-13 DIAGNOSIS — E785 Hyperlipidemia, unspecified: Secondary | ICD-10-CM | POA: Diagnosis not present

## 2020-05-13 DIAGNOSIS — I10 Essential (primary) hypertension: Secondary | ICD-10-CM | POA: Diagnosis not present

## 2020-05-13 DIAGNOSIS — Z6831 Body mass index (BMI) 31.0-31.9, adult: Secondary | ICD-10-CM | POA: Diagnosis not present

## 2020-05-13 DIAGNOSIS — E1165 Type 2 diabetes mellitus with hyperglycemia: Secondary | ICD-10-CM | POA: Diagnosis not present

## 2020-05-25 DIAGNOSIS — M109 Gout, unspecified: Secondary | ICD-10-CM | POA: Diagnosis not present

## 2020-05-25 DIAGNOSIS — N1832 Chronic kidney disease, stage 3b: Secondary | ICD-10-CM | POA: Diagnosis not present

## 2020-05-25 DIAGNOSIS — Z7984 Long term (current) use of oral hypoglycemic drugs: Secondary | ICD-10-CM | POA: Diagnosis not present

## 2020-05-25 DIAGNOSIS — C61 Malignant neoplasm of prostate: Secondary | ICD-10-CM | POA: Diagnosis not present

## 2020-05-25 DIAGNOSIS — I10 Essential (primary) hypertension: Secondary | ICD-10-CM | POA: Diagnosis not present

## 2020-05-25 DIAGNOSIS — E785 Hyperlipidemia, unspecified: Secondary | ICD-10-CM | POA: Diagnosis not present

## 2020-05-25 DIAGNOSIS — E1169 Type 2 diabetes mellitus with other specified complication: Secondary | ICD-10-CM | POA: Diagnosis not present

## 2020-06-16 DIAGNOSIS — E109 Type 1 diabetes mellitus without complications: Secondary | ICD-10-CM | POA: Diagnosis not present

## 2020-07-26 DIAGNOSIS — C61 Malignant neoplasm of prostate: Secondary | ICD-10-CM | POA: Diagnosis not present

## 2020-08-02 DIAGNOSIS — N400 Enlarged prostate without lower urinary tract symptoms: Secondary | ICD-10-CM | POA: Diagnosis not present

## 2020-08-02 DIAGNOSIS — R972 Elevated prostate specific antigen [PSA]: Secondary | ICD-10-CM | POA: Diagnosis not present

## 2020-08-02 DIAGNOSIS — C61 Malignant neoplasm of prostate: Secondary | ICD-10-CM | POA: Diagnosis not present

## 2020-08-31 DIAGNOSIS — E1169 Type 2 diabetes mellitus with other specified complication: Secondary | ICD-10-CM | POA: Diagnosis not present

## 2020-12-01 DIAGNOSIS — Z0000 Encounter for general adult medical examination without abnormal findings: Secondary | ICD-10-CM | POA: Diagnosis not present

## 2020-12-01 DIAGNOSIS — M109 Gout, unspecified: Secondary | ICD-10-CM | POA: Diagnosis not present

## 2020-12-01 DIAGNOSIS — I10 Essential (primary) hypertension: Secondary | ICD-10-CM | POA: Diagnosis not present

## 2020-12-01 DIAGNOSIS — E669 Obesity, unspecified: Secondary | ICD-10-CM | POA: Diagnosis not present

## 2020-12-01 DIAGNOSIS — N1832 Chronic kidney disease, stage 3b: Secondary | ICD-10-CM | POA: Diagnosis not present

## 2020-12-01 DIAGNOSIS — C61 Malignant neoplasm of prostate: Secondary | ICD-10-CM | POA: Diagnosis not present

## 2020-12-01 DIAGNOSIS — Z23 Encounter for immunization: Secondary | ICD-10-CM | POA: Diagnosis not present

## 2020-12-01 DIAGNOSIS — E785 Hyperlipidemia, unspecified: Secondary | ICD-10-CM | POA: Diagnosis not present

## 2020-12-01 DIAGNOSIS — E1169 Type 2 diabetes mellitus with other specified complication: Secondary | ICD-10-CM | POA: Diagnosis not present

## 2020-12-14 DIAGNOSIS — N1832 Chronic kidney disease, stage 3b: Secondary | ICD-10-CM | POA: Diagnosis not present

## 2020-12-22 DIAGNOSIS — I129 Hypertensive chronic kidney disease with stage 1 through stage 4 chronic kidney disease, or unspecified chronic kidney disease: Secondary | ICD-10-CM | POA: Diagnosis not present

## 2020-12-22 DIAGNOSIS — N179 Acute kidney failure, unspecified: Secondary | ICD-10-CM | POA: Diagnosis not present

## 2020-12-22 DIAGNOSIS — N1832 Chronic kidney disease, stage 3b: Secondary | ICD-10-CM | POA: Diagnosis not present

## 2021-03-14 DIAGNOSIS — C61 Malignant neoplasm of prostate: Secondary | ICD-10-CM | POA: Diagnosis not present

## 2021-03-21 DIAGNOSIS — N401 Enlarged prostate with lower urinary tract symptoms: Secondary | ICD-10-CM | POA: Diagnosis not present

## 2021-03-21 DIAGNOSIS — R351 Nocturia: Secondary | ICD-10-CM | POA: Diagnosis not present

## 2021-03-21 DIAGNOSIS — C61 Malignant neoplasm of prostate: Secondary | ICD-10-CM | POA: Diagnosis not present

## 2021-07-05 DIAGNOSIS — E785 Hyperlipidemia, unspecified: Secondary | ICD-10-CM | POA: Diagnosis not present

## 2021-07-05 DIAGNOSIS — E669 Obesity, unspecified: Secondary | ICD-10-CM | POA: Diagnosis not present

## 2021-07-05 DIAGNOSIS — C61 Malignant neoplasm of prostate: Secondary | ICD-10-CM | POA: Diagnosis not present

## 2021-07-05 DIAGNOSIS — N1832 Chronic kidney disease, stage 3b: Secondary | ICD-10-CM | POA: Diagnosis not present

## 2021-07-05 DIAGNOSIS — E1169 Type 2 diabetes mellitus with other specified complication: Secondary | ICD-10-CM | POA: Diagnosis not present

## 2021-07-05 DIAGNOSIS — M109 Gout, unspecified: Secondary | ICD-10-CM | POA: Diagnosis not present

## 2021-07-05 DIAGNOSIS — I10 Essential (primary) hypertension: Secondary | ICD-10-CM | POA: Diagnosis not present

## 2021-08-18 DIAGNOSIS — E119 Type 2 diabetes mellitus without complications: Secondary | ICD-10-CM | POA: Diagnosis not present

## 2021-12-13 DIAGNOSIS — C61 Malignant neoplasm of prostate: Secondary | ICD-10-CM | POA: Diagnosis not present

## 2021-12-13 DIAGNOSIS — N1832 Chronic kidney disease, stage 3b: Secondary | ICD-10-CM | POA: Diagnosis not present

## 2021-12-13 DIAGNOSIS — M109 Gout, unspecified: Secondary | ICD-10-CM | POA: Diagnosis not present

## 2021-12-13 DIAGNOSIS — Z0000 Encounter for general adult medical examination without abnormal findings: Secondary | ICD-10-CM | POA: Diagnosis not present

## 2021-12-13 DIAGNOSIS — Z23 Encounter for immunization: Secondary | ICD-10-CM | POA: Diagnosis not present

## 2021-12-13 DIAGNOSIS — E1169 Type 2 diabetes mellitus with other specified complication: Secondary | ICD-10-CM | POA: Diagnosis not present

## 2021-12-13 DIAGNOSIS — E669 Obesity, unspecified: Secondary | ICD-10-CM | POA: Diagnosis not present

## 2021-12-13 DIAGNOSIS — E785 Hyperlipidemia, unspecified: Secondary | ICD-10-CM | POA: Diagnosis not present

## 2021-12-13 DIAGNOSIS — I10 Essential (primary) hypertension: Secondary | ICD-10-CM | POA: Diagnosis not present

## 2021-12-13 DIAGNOSIS — Z1331 Encounter for screening for depression: Secondary | ICD-10-CM | POA: Diagnosis not present

## 2022-01-18 DIAGNOSIS — E1169 Type 2 diabetes mellitus with other specified complication: Secondary | ICD-10-CM | POA: Diagnosis not present

## 2022-03-08 DIAGNOSIS — N1832 Chronic kidney disease, stage 3b: Secondary | ICD-10-CM | POA: Diagnosis not present

## 2022-03-12 DIAGNOSIS — E1122 Type 2 diabetes mellitus with diabetic chronic kidney disease: Secondary | ICD-10-CM | POA: Diagnosis not present

## 2022-03-12 DIAGNOSIS — N1832 Chronic kidney disease, stage 3b: Secondary | ICD-10-CM | POA: Diagnosis not present

## 2022-03-12 DIAGNOSIS — I129 Hypertensive chronic kidney disease with stage 1 through stage 4 chronic kidney disease, or unspecified chronic kidney disease: Secondary | ICD-10-CM | POA: Diagnosis not present

## 2022-04-26 DIAGNOSIS — C61 Malignant neoplasm of prostate: Secondary | ICD-10-CM | POA: Diagnosis not present

## 2022-06-14 DIAGNOSIS — N1832 Chronic kidney disease, stage 3b: Secondary | ICD-10-CM | POA: Diagnosis not present

## 2022-06-14 DIAGNOSIS — E1122 Type 2 diabetes mellitus with diabetic chronic kidney disease: Secondary | ICD-10-CM | POA: Diagnosis not present

## 2022-06-14 DIAGNOSIS — E1165 Type 2 diabetes mellitus with hyperglycemia: Secondary | ICD-10-CM | POA: Diagnosis not present

## 2022-06-14 DIAGNOSIS — E785 Hyperlipidemia, unspecified: Secondary | ICD-10-CM | POA: Diagnosis not present

## 2022-06-14 DIAGNOSIS — I10 Essential (primary) hypertension: Secondary | ICD-10-CM | POA: Diagnosis not present

## 2022-06-14 DIAGNOSIS — E1169 Type 2 diabetes mellitus with other specified complication: Secondary | ICD-10-CM | POA: Diagnosis not present

## 2022-06-14 DIAGNOSIS — M109 Gout, unspecified: Secondary | ICD-10-CM | POA: Diagnosis not present

## 2022-08-23 DIAGNOSIS — I129 Hypertensive chronic kidney disease with stage 1 through stage 4 chronic kidney disease, or unspecified chronic kidney disease: Secondary | ICD-10-CM | POA: Diagnosis not present

## 2022-08-23 DIAGNOSIS — Z008 Encounter for other general examination: Secondary | ICD-10-CM | POA: Diagnosis not present

## 2022-08-23 DIAGNOSIS — I8390 Asymptomatic varicose veins of unspecified lower extremity: Secondary | ICD-10-CM | POA: Diagnosis not present

## 2022-08-23 DIAGNOSIS — E785 Hyperlipidemia, unspecified: Secondary | ICD-10-CM | POA: Diagnosis not present

## 2022-08-23 DIAGNOSIS — I872 Venous insufficiency (chronic) (peripheral): Secondary | ICD-10-CM | POA: Diagnosis not present

## 2022-08-23 DIAGNOSIS — E1122 Type 2 diabetes mellitus with diabetic chronic kidney disease: Secondary | ICD-10-CM | POA: Diagnosis not present

## 2022-08-23 DIAGNOSIS — K219 Gastro-esophageal reflux disease without esophagitis: Secondary | ICD-10-CM | POA: Diagnosis not present

## 2022-08-23 DIAGNOSIS — Z809 Family history of malignant neoplasm, unspecified: Secondary | ICD-10-CM | POA: Diagnosis not present

## 2022-08-23 DIAGNOSIS — Z87891 Personal history of nicotine dependence: Secondary | ICD-10-CM | POA: Diagnosis not present

## 2022-08-23 DIAGNOSIS — E669 Obesity, unspecified: Secondary | ICD-10-CM | POA: Diagnosis not present

## 2022-08-23 DIAGNOSIS — E1165 Type 2 diabetes mellitus with hyperglycemia: Secondary | ICD-10-CM | POA: Diagnosis not present

## 2022-08-30 DIAGNOSIS — I129 Hypertensive chronic kidney disease with stage 1 through stage 4 chronic kidney disease, or unspecified chronic kidney disease: Secondary | ICD-10-CM | POA: Diagnosis not present

## 2022-08-30 DIAGNOSIS — E1122 Type 2 diabetes mellitus with diabetic chronic kidney disease: Secondary | ICD-10-CM | POA: Diagnosis not present

## 2022-08-30 DIAGNOSIS — N1832 Chronic kidney disease, stage 3b: Secondary | ICD-10-CM | POA: Diagnosis not present

## 2022-08-30 DIAGNOSIS — M109 Gout, unspecified: Secondary | ICD-10-CM | POA: Diagnosis not present

## 2022-08-30 DIAGNOSIS — N390 Urinary tract infection, site not specified: Secondary | ICD-10-CM | POA: Diagnosis not present

## 2022-09-27 DIAGNOSIS — E1169 Type 2 diabetes mellitus with other specified complication: Secondary | ICD-10-CM | POA: Diagnosis not present

## 2022-10-11 DIAGNOSIS — R351 Nocturia: Secondary | ICD-10-CM | POA: Diagnosis not present

## 2022-10-11 DIAGNOSIS — C61 Malignant neoplasm of prostate: Secondary | ICD-10-CM | POA: Diagnosis not present

## 2022-11-15 DIAGNOSIS — E119 Type 2 diabetes mellitus without complications: Secondary | ICD-10-CM | POA: Diagnosis not present

## 2023-01-24 DIAGNOSIS — K5903 Drug induced constipation: Secondary | ICD-10-CM | POA: Diagnosis not present

## 2023-02-21 DIAGNOSIS — E876 Hypokalemia: Secondary | ICD-10-CM | POA: Diagnosis not present

## 2023-03-14 DIAGNOSIS — N1832 Chronic kidney disease, stage 3b: Secondary | ICD-10-CM | POA: Diagnosis not present

## 2023-03-21 DIAGNOSIS — N1832 Chronic kidney disease, stage 3b: Secondary | ICD-10-CM | POA: Diagnosis not present

## 2023-03-21 DIAGNOSIS — E1122 Type 2 diabetes mellitus with diabetic chronic kidney disease: Secondary | ICD-10-CM | POA: Diagnosis not present

## 2023-03-21 DIAGNOSIS — I129 Hypertensive chronic kidney disease with stage 1 through stage 4 chronic kidney disease, or unspecified chronic kidney disease: Secondary | ICD-10-CM | POA: Diagnosis not present

## 2023-03-21 DIAGNOSIS — E1129 Type 2 diabetes mellitus with other diabetic kidney complication: Secondary | ICD-10-CM | POA: Diagnosis not present

## 2023-08-17 DIAGNOSIS — E663 Overweight: Secondary | ICD-10-CM | POA: Diagnosis not present

## 2023-08-17 DIAGNOSIS — E785 Hyperlipidemia, unspecified: Secondary | ICD-10-CM | POA: Diagnosis not present

## 2023-08-17 DIAGNOSIS — E1165 Type 2 diabetes mellitus with hyperglycemia: Secondary | ICD-10-CM | POA: Diagnosis not present

## 2023-08-17 DIAGNOSIS — N1832 Chronic kidney disease, stage 3b: Secondary | ICD-10-CM | POA: Diagnosis not present

## 2023-09-30 DIAGNOSIS — N1832 Chronic kidney disease, stage 3b: Secondary | ICD-10-CM | POA: Diagnosis not present

## 2023-10-09 DIAGNOSIS — I129 Hypertensive chronic kidney disease with stage 1 through stage 4 chronic kidney disease, or unspecified chronic kidney disease: Secondary | ICD-10-CM | POA: Diagnosis not present

## 2023-10-09 DIAGNOSIS — E1122 Type 2 diabetes mellitus with diabetic chronic kidney disease: Secondary | ICD-10-CM | POA: Diagnosis not present

## 2023-10-09 DIAGNOSIS — N1832 Chronic kidney disease, stage 3b: Secondary | ICD-10-CM | POA: Diagnosis not present

## 2023-10-13 DIAGNOSIS — H35033 Hypertensive retinopathy, bilateral: Secondary | ICD-10-CM | POA: Diagnosis not present

## 2023-10-13 DIAGNOSIS — N1832 Chronic kidney disease, stage 3b: Secondary | ICD-10-CM | POA: Diagnosis not present

## 2023-10-13 DIAGNOSIS — E1169 Type 2 diabetes mellitus with other specified complication: Secondary | ICD-10-CM | POA: Diagnosis not present

## 2023-10-13 DIAGNOSIS — I10 Essential (primary) hypertension: Secondary | ICD-10-CM | POA: Diagnosis not present

## 2023-10-13 DIAGNOSIS — E785 Hyperlipidemia, unspecified: Secondary | ICD-10-CM | POA: Diagnosis not present

## 2023-10-19 DIAGNOSIS — E1165 Type 2 diabetes mellitus with hyperglycemia: Secondary | ICD-10-CM | POA: Diagnosis not present

## 2023-10-19 DIAGNOSIS — E785 Hyperlipidemia, unspecified: Secondary | ICD-10-CM | POA: Diagnosis not present

## 2023-10-19 DIAGNOSIS — N1832 Chronic kidney disease, stage 3b: Secondary | ICD-10-CM | POA: Diagnosis not present

## 2023-11-05 DIAGNOSIS — E119 Type 2 diabetes mellitus without complications: Secondary | ICD-10-CM | POA: Diagnosis not present

## 2023-11-18 DIAGNOSIS — E1165 Type 2 diabetes mellitus with hyperglycemia: Secondary | ICD-10-CM | POA: Diagnosis not present

## 2023-11-18 DIAGNOSIS — N1832 Chronic kidney disease, stage 3b: Secondary | ICD-10-CM | POA: Diagnosis not present

## 2023-11-18 DIAGNOSIS — E785 Hyperlipidemia, unspecified: Secondary | ICD-10-CM | POA: Diagnosis not present
